# Patient Record
Sex: Female | Born: 1980 | Race: Black or African American | Hispanic: No | Marital: Married | Smoking: Current every day smoker
Health system: Southern US, Community
[De-identification: ages and names within clinical notes are randomized; demographics above are authoritative.]

## PROBLEM LIST (undated history)

## (undated) DIAGNOSIS — I639 Cerebral infarction, unspecified: Secondary | ICD-10-CM

## (undated) DIAGNOSIS — J45909 Unspecified asthma, uncomplicated: Secondary | ICD-10-CM

## (undated) DIAGNOSIS — H547 Unspecified visual loss: Secondary | ICD-10-CM

## (undated) DIAGNOSIS — D6861 Antiphospholipid syndrome: Secondary | ICD-10-CM

## (undated) DIAGNOSIS — I10 Essential (primary) hypertension: Secondary | ICD-10-CM

## (undated) HISTORY — PX: OTHER SURGICAL HISTORY: SHX169

## (undated) HISTORY — DX: Cerebral infarction, unspecified: I63.9

---

## 2014-04-04 DIAGNOSIS — D649 Anemia, unspecified: Secondary | ICD-10-CM | POA: Diagnosis not present

## 2014-04-04 DIAGNOSIS — Z136 Encounter for screening for cardiovascular disorders: Secondary | ICD-10-CM | POA: Diagnosis not present

## 2014-04-04 DIAGNOSIS — Z8673 Personal history of transient ischemic attack (TIA), and cerebral infarction without residual deficits: Secondary | ICD-10-CM | POA: Diagnosis not present

## 2014-04-04 DIAGNOSIS — H540 Blindness, both eyes: Secondary | ICD-10-CM | POA: Diagnosis not present

## 2014-04-04 DIAGNOSIS — E559 Vitamin D deficiency, unspecified: Secondary | ICD-10-CM | POA: Diagnosis not present

## 2014-04-04 DIAGNOSIS — R791 Abnormal coagulation profile: Secondary | ICD-10-CM | POA: Diagnosis not present

## 2014-04-04 DIAGNOSIS — Z7901 Long term (current) use of anticoagulants: Secondary | ICD-10-CM | POA: Diagnosis not present

## 2014-04-04 DIAGNOSIS — G479 Sleep disorder, unspecified: Secondary | ICD-10-CM | POA: Diagnosis not present

## 2014-04-04 DIAGNOSIS — D6861 Antiphospholipid syndrome: Secondary | ICD-10-CM | POA: Diagnosis not present

## 2014-04-04 DIAGNOSIS — Z8349 Family history of other endocrine, nutritional and metabolic diseases: Secondary | ICD-10-CM | POA: Diagnosis not present

## 2014-04-10 ENCOUNTER — Encounter (HOSPITAL_COMMUNITY): Payer: Self-pay | Admitting: Emergency Medicine

## 2014-04-10 ENCOUNTER — Emergency Department (HOSPITAL_COMMUNITY)
Admission: EM | Admit: 2014-04-10 | Discharge: 2014-04-10 | Disposition: A | Discharge status: Home / Self Care | Payer: Medicare Other | Attending: Emergency Medicine | Admitting: Emergency Medicine

## 2014-04-10 ENCOUNTER — Emergency Department (HOSPITAL_COMMUNITY): Payer: Medicare Other

## 2014-04-10 DIAGNOSIS — J45909 Unspecified asthma, uncomplicated: Secondary | ICD-10-CM | POA: Insufficient documentation

## 2014-04-10 DIAGNOSIS — I10 Essential (primary) hypertension: Secondary | ICD-10-CM | POA: Diagnosis not present

## 2014-04-10 DIAGNOSIS — M542 Cervicalgia: Secondary | ICD-10-CM | POA: Diagnosis not present

## 2014-04-10 DIAGNOSIS — S199XXA Unspecified injury of neck, initial encounter: Secondary | ICD-10-CM | POA: Diagnosis not present

## 2014-04-10 DIAGNOSIS — E669 Obesity, unspecified: Secondary | ICD-10-CM | POA: Insufficient documentation

## 2014-04-10 DIAGNOSIS — Z793 Long term (current) use of hormonal contraceptives: Secondary | ICD-10-CM | POA: Diagnosis not present

## 2014-04-10 DIAGNOSIS — M2578 Osteophyte, vertebrae: Secondary | ICD-10-CM | POA: Diagnosis not present

## 2014-04-10 DIAGNOSIS — Z7901 Long term (current) use of anticoagulants: Secondary | ICD-10-CM | POA: Insufficient documentation

## 2014-04-10 DIAGNOSIS — Z87891 Personal history of nicotine dependence: Secondary | ICD-10-CM | POA: Diagnosis not present

## 2014-04-10 DIAGNOSIS — Z79899 Other long term (current) drug therapy: Secondary | ICD-10-CM | POA: Insufficient documentation

## 2014-04-10 DIAGNOSIS — Z862 Personal history of diseases of the blood and blood-forming organs and certain disorders involving the immune mechanism: Secondary | ICD-10-CM | POA: Insufficient documentation

## 2014-04-10 DIAGNOSIS — H540 Blindness, both eyes: Secondary | ICD-10-CM | POA: Diagnosis not present

## 2014-04-10 DIAGNOSIS — R51 Headache: Secondary | ICD-10-CM | POA: Diagnosis not present

## 2014-04-10 HISTORY — DX: Essential (primary) hypertension: I10

## 2014-04-10 HISTORY — DX: Unspecified asthma, uncomplicated: J45.909

## 2014-04-10 HISTORY — DX: Unspecified visual loss: H54.7

## 2014-04-10 HISTORY — DX: Antiphospholipid syndrome: D68.61

## 2014-04-10 LAB — PROTIME-INR
INR: 1.88 — ABNORMAL HIGH (ref 0.00–1.49)
Prothrombin Time: 21.8 seconds — ABNORMAL HIGH (ref 11.6–15.2)

## 2014-04-10 MED ORDER — OXYCODONE-ACETAMINOPHEN 5-325 MG PO TABS: 1.0000 | ORAL_TABLET | ORAL | Status: DC | PRN

## 2014-04-10 MED ORDER — HYDROMORPHONE HCL 1 MG/ML IJ SOLN
1.0000 mg | Freq: Once | INTRAMUSCULAR | Status: AC
  Administered 2014-04-10: 1 mg via INTRAMUSCULAR
  Filled 2014-04-10: qty 1

## 2014-04-10 MED ORDER — DIPHENHYDRAMINE HCL 25 MG PO CAPS
25.0000 mg | ORAL_CAPSULE | Freq: Once | ORAL | Status: AC
  Administered 2014-04-10: 25 mg via ORAL
  Filled 2014-04-10: qty 1

## 2014-04-10 NOTE — ED Notes (Signed)
Bed: WTR5 Expected date:  Expected time:  Means of arrival:  Comments: Neck pain

## 2014-04-10 NOTE — ED Notes (Signed)
Pt reports that she has been having increased neck pain for the past week. Pt has recently moved, found a new PCP that checked her INR since she was on Coumadin and it was too high. New PCP told her to not take any Coumadin over the weekend and to return. Pt states pain was too much to bear to wait until she could be seen again. 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

## 2014-04-10 NOTE — Progress Notes (Signed)
CSW received notification from RN stating that the patient is ready to be discharged and needs a taxi voucher. CSW met with patient at bedside and confirmed that the patient wanted to be driven to [address]. CSW called taxi and informed them that the patient is ready for pick up.  Brittney Whitaker, LCSWA [phone] ED CSW 04/10/2014 3:52 PM    

## 2014-04-10 NOTE — ED Notes (Signed)
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 Pt also sts she takes coumadin for blood disorder APAS and last Thursday went to her PCP to have her levels checked and was told to stop taking it until Sunday (it was 6.1). Pt was supposed to have it rechecked, however due to neck pain she didn't go back, however she did start taking her coumadin again on Sunday as discussed with her doctor.

## 2014-04-10 NOTE — Progress Notes (Signed)
CSW received notification from RN that pt brought in by [facility] and will need assist with transportation via bus pass or cab voucher when released. Per RN, pt not yet released and RN will notify CSW by calling [phone] when assistance with transport needed.  Loletta SpecterSuzanna Kidd, MSW, LCSW Clinical Social Work Coverage for [employer]en Reed, [place] [phone]

## 2014-04-15 NOTE — ED Provider Notes (Signed)
CSN: [id number]     Arrival date & time 04/10/14  1151 History   First MD Initiated Contact with Patient 04/10/14 1232     Chief Complaint  Patient presents with  . Neck Pain     (Consider location/radiation/quality/duration/timing/severity/associated sxs/prior Treatment) HPI   34 year old female with neck pain. Gradual onset a few days ago and progressively worsening. Pain is worse with certain movements. She denies any trauma. She does report that she holds her head down for long periods of time as required by her new employment. No fevers or chills. No acute numbness, tingling or focal loss of strength.  Past Medical History  Diagnosis Date  . Blind   . Blind   . Anti-phospholipid antibody syndrome   . Hypertension   . Asthma    History reviewed. No pertinent past surgical history. History reviewed. No pertinent family history. History  Substance Use Topics  . Smoking status: Former Smoker    Types: Cigarettes    Quit date: 04/04/2014  . Smokeless tobacco: Never Used  . Alcohol Use: Yes     Comment: occ.   OB History    No data available     Review of Systems  All systems reviewed and negative, other than as noted in HPI.   Allergies  Review of patient's allergies indicates no known allergies.  Home Medications   Prior to Admission medications   Medication Sig Start Date End Date Taking? Authorizing Provider  albuterol (PROVENTIL HFA;VENTOLIN HFA) 108 (90 BASE) MCG/ACT inhaler Inhale 1-2 puffs into the lungs every 6 (six) hours as needed for wheezing or shortness of breath.   Yes Historical Provider, MD  lisinopril (PRINIVIL,ZESTRIL) 10 MG tablet Take 10 mg by mouth daily.   Yes Historical Provider, MD  norethindrone-ethinyl estradiol 1/35 (ORTHO-NOVUM, NORTREL,CYCLAFEM) tablet Take 1 tablet by mouth daily.   Yes Historical Provider, MD  Pseudoephedrine-APAP-DM (DAYQUIL PO) Take 2 capsules by mouth every 8 (eight) hours.   Yes Historical Provider, MD  temazepam  (RESTORIL) 30 MG capsule Take 30 mg by mouth at bedtime as needed for sleep.   Yes Historical Provider, MD  valACYclovir (VALTREX) 500 MG tablet Take 500 mg by mouth daily.   Yes Historical Provider, MD  warfarin (COUMADIN) 4 MG tablet Take 8 mg by mouth daily.   Yes Historical Provider, MD  oxyCODONE-acetaminophen (PERCOCET/ROXICET) 5-325 MG per tablet Take 1 tablet by mouth every 4 (four) hours as needed for severe pain. 04/10/14   Raeford RazorStephen Daliana Leverett, MD   BP 138/70 mmHg  Pulse 82  Temp(Src) 98 F (36.7 C) (Oral)  Resp 17  Ht 5\' 2"  (1.575 m)  Wt 370 lb (167.831 kg)  BMI 67.66 kg/m2  SpO2 93%  LMP 03/27/2014 (Approximate) Physical Exam  Constitutional: She appears well-developed and well-nourished. No distress.  HENT:  Head: Normocephalic and atraumatic.  Eyes: Conjunctivae are normal. Right eye exhibits no discharge. Left eye exhibits no discharge.  Neck: Neck supple.  Obesity limits examination, but it gets grossly unremarkable in appearance. Trachea is midline. Thyroid does not seem appreciably enlarged. No adenopathy or other masses appreciated. Normal stomach phonation. No skin lesions noted. Some tenderness to the posterior neck, but this seems more paraspinal than in the midline  Cardiovascular: Normal rate, regular rhythm and normal heart sounds.  Exam reveals no gallop and no friction rub.   No murmur heard. Pulmonary/Chest: Effort normal and breath sounds normal. No respiratory distress.  Abdominal: Soft. She exhibits no distension. There is no tenderness.  Musculoskeletal:  She exhibits no edema or tenderness.  Neurological: She is alert.  Strength is 5 out of 5 bilateral upper extremities. Sensation is intact to light touch. Easily palpable radial pulses. Cap refill is brisk in the fingertips.  Skin: Skin is warm and dry.  Psychiatric: She has a normal mood and affect. Her behavior is normal. Thought content normal.  Nursing note and vitals reviewed.   ED Course  Procedures  (including critical care time) Labs Review Labs Reviewed  PROTIME-INR - Abnormal; Notable for the following:    Prothrombin Time 21.8 (*)    INR 1.88 (*)    All other components within normal limits    Imaging Review No results found.   EKG Interpretation None      MDM   Final diagnoses:  Neck pain    34 year old female with neck pain. Atraumatic. Nonfocal neuro exam. Suspect strain, possibly related to head positioning while working. Low suspicion for emergent process.    Raeford Razor, MD 04/15/14 1025

## 2014-04-22 DIAGNOSIS — Z7901 Long term (current) use of anticoagulants: Secondary | ICD-10-CM | POA: Diagnosis not present

## 2014-04-22 DIAGNOSIS — M542 Cervicalgia: Secondary | ICD-10-CM | POA: Diagnosis not present

## 2014-05-29 DIAGNOSIS — J209 Acute bronchitis, unspecified: Secondary | ICD-10-CM | POA: Diagnosis not present

## 2014-05-29 DIAGNOSIS — J329 Chronic sinusitis, unspecified: Secondary | ICD-10-CM | POA: Diagnosis not present

## 2014-05-29 DIAGNOSIS — R42 Dizziness and giddiness: Secondary | ICD-10-CM | POA: Diagnosis not present

## 2014-05-29 DIAGNOSIS — R079 Chest pain, unspecified: Secondary | ICD-10-CM | POA: Diagnosis not present

## 2014-05-29 DIAGNOSIS — R05 Cough: Secondary | ICD-10-CM | POA: Diagnosis not present

## 2014-05-30 ENCOUNTER — Encounter (HOSPITAL_COMMUNITY): Payer: Self-pay

## 2014-05-30 ENCOUNTER — Inpatient Hospital Stay (HOSPITAL_COMMUNITY)
Admission: EM | Admit: 2014-05-30 | Discharge: 2014-06-01 | DRG: 202 | Disposition: A | Discharge status: Home / Self Care | Payer: Medicare Other | Attending: Internal Medicine | Admitting: Internal Medicine

## 2014-05-30 ENCOUNTER — Emergency Department (HOSPITAL_COMMUNITY): Payer: Medicare Other

## 2014-05-30 DIAGNOSIS — J019 Acute sinusitis, unspecified: Secondary | ICD-10-CM

## 2014-05-30 DIAGNOSIS — R0602 Shortness of breath: Secondary | ICD-10-CM | POA: Insufficient documentation

## 2014-05-30 DIAGNOSIS — R0989 Other specified symptoms and signs involving the circulatory and respiratory systems: Secondary | ICD-10-CM | POA: Diagnosis not present

## 2014-05-30 DIAGNOSIS — Z6841 Body Mass Index (BMI) 40.0 and over, adult: Secondary | ICD-10-CM

## 2014-05-30 DIAGNOSIS — Z720 Tobacco use: Secondary | ICD-10-CM

## 2014-05-30 DIAGNOSIS — I10 Essential (primary) hypertension: Secondary | ICD-10-CM | POA: Diagnosis not present

## 2014-05-30 DIAGNOSIS — F17210 Nicotine dependence, cigarettes, uncomplicated: Secondary | ICD-10-CM | POA: Insufficient documentation

## 2014-05-30 DIAGNOSIS — H548 Legal blindness, as defined in USA: Secondary | ICD-10-CM | POA: Diagnosis present

## 2014-05-30 DIAGNOSIS — R079 Chest pain, unspecified: Secondary | ICD-10-CM | POA: Diagnosis not present

## 2014-05-30 DIAGNOSIS — Z7901 Long term (current) use of anticoagulants: Secondary | ICD-10-CM

## 2014-05-30 DIAGNOSIS — E669 Obesity, unspecified: Secondary | ICD-10-CM | POA: Diagnosis present

## 2014-05-30 DIAGNOSIS — J45901 Unspecified asthma with (acute) exacerbation: Principal | ICD-10-CM | POA: Diagnosis present

## 2014-05-30 DIAGNOSIS — J0100 Acute maxillary sinusitis, unspecified: Secondary | ICD-10-CM

## 2014-05-30 DIAGNOSIS — R05 Cough: Secondary | ICD-10-CM | POA: Diagnosis not present

## 2014-05-30 DIAGNOSIS — Z87891 Personal history of nicotine dependence: Secondary | ICD-10-CM | POA: Diagnosis not present

## 2014-05-30 DIAGNOSIS — T380X5A Adverse effect of glucocorticoids and synthetic analogues, initial encounter: Secondary | ICD-10-CM | POA: Diagnosis present

## 2014-05-30 DIAGNOSIS — D72829 Elevated white blood cell count, unspecified: Secondary | ICD-10-CM | POA: Diagnosis present

## 2014-05-30 DIAGNOSIS — I340 Nonrheumatic mitral (valve) insufficiency: Secondary | ICD-10-CM | POA: Diagnosis present

## 2014-05-30 DIAGNOSIS — I2699 Other pulmonary embolism without acute cor pulmonale: Secondary | ICD-10-CM | POA: Diagnosis not present

## 2014-05-30 DIAGNOSIS — R0789 Other chest pain: Secondary | ICD-10-CM | POA: Diagnosis not present

## 2014-05-30 DIAGNOSIS — J45909 Unspecified asthma, uncomplicated: Secondary | ICD-10-CM | POA: Diagnosis present

## 2014-05-30 DIAGNOSIS — D6861 Antiphospholipid syndrome: Secondary | ICD-10-CM | POA: Diagnosis present

## 2014-05-30 DIAGNOSIS — R000 Tachycardia, unspecified: Secondary | ICD-10-CM | POA: Diagnosis present

## 2014-05-30 HISTORY — DX: Tobacco use: Z72.0

## 2014-05-30 LAB — CBC WITH DIFFERENTIAL/PLATELET
Basophils Absolute: 0.1 10*3/uL (ref 0.0–0.1)
Basophils Relative: 1 % (ref 0–1)
EOS ABS: 0.1 10*3/uL (ref 0.0–0.7)
Eosinophils Relative: 1 % (ref 0–5)
HCT: 37.3 % (ref 36.0–46.0)
Hemoglobin: 12.2 g/dL (ref 12.0–15.0)
LYMPHS ABS: 1.5 10*3/uL (ref 0.7–4.0)
Lymphocytes Relative: 16 % (ref 12–46)
MCH: 27 pg (ref 26.0–34.0)
MCHC: 32.7 g/dL (ref 30.0–36.0)
MCV: 82.5 fL (ref 78.0–100.0)
Monocytes Absolute: 0.2 10*3/uL (ref 0.1–1.0)
Monocytes Relative: 2 % — ABNORMAL LOW (ref 3–12)
NEUTROS ABS: 7.3 10*3/uL (ref 1.7–7.7)
Neutrophils Relative %: 80 % — ABNORMAL HIGH (ref 43–77)
Platelets: ADEQUATE 10*3/uL (ref 150–400)
RBC: 4.52 MIL/uL (ref 3.87–5.11)
RDW: 15.1 % (ref 11.5–15.5)
WBC: 9.2 10*3/uL (ref 4.0–10.5)

## 2014-05-30 LAB — LIPID PANEL
CHOLESTEROL: 170 mg/dL (ref 0–200)
HDL: 52 mg/dL (ref >39)
LDL Cholesterol: 99 mg/dL (ref 0–99)
TRIGLYCERIDES: 96 mg/dL (ref <150)
Total CHOL/HDL Ratio: 3.3 RATIO
VLDL: 19 mg/dL (ref 0–40)

## 2014-05-30 LAB — BASIC METABOLIC PANEL
ANION GAP: 12 (ref 5–15)
BUN: 12 mg/dL (ref 6–23)
CO2: 21 mmol/L (ref 19–32)
Calcium: 9.2 mg/dL (ref 8.4–10.5)
Chloride: 105 mmol/L (ref 96–112)
Creatinine, Ser: 0.68 mg/dL (ref 0.50–1.10)
GFR calc Af Amer: >90 mL/min (ref >90)
GFR calc non Af Amer: >90 mL/min (ref >90)
GLUCOSE: 113 mg/dL — AB (ref 70–99)
POTASSIUM: 3.8 mmol/L (ref 3.5–5.1)
Sodium: 138 mmol/L (ref 135–145)

## 2014-05-30 LAB — I-STAT TROPONIN, ED: TROPONIN I, POC: 0 ng/mL (ref 0.00–0.08)

## 2014-05-30 LAB — PHOSPHORUS: PHOSPHORUS: 1.6 mg/dL — AB (ref 2.3–4.6)

## 2014-05-30 LAB — PROTIME-INR
INR: 2.5 — ABNORMAL HIGH (ref 0.00–1.49)
PROTHROMBIN TIME: 27.2 s — AB (ref 11.6–15.2)

## 2014-05-30 LAB — MAGNESIUM: Magnesium: 1.8 mg/dL (ref 1.5–2.5)

## 2014-05-30 LAB — TROPONIN I: Troponin I: <0.03 ng/mL (ref <0.031)

## 2014-05-30 LAB — TSH: TSH: 1.146 u[IU]/mL (ref 0.350–4.500)

## 2014-05-30 LAB — BRAIN NATRIURETIC PEPTIDE: B NATRIURETIC PEPTIDE 5: 31 pg/mL (ref 0.0–100.0)

## 2014-05-30 MED ORDER — HYDROCODONE-ACETAMINOPHEN 5-325 MG PO TABS
1.0000 | ORAL_TABLET | ORAL | Status: DC | PRN
  Administered 2014-05-30 – 2014-06-01 (×3): 1 via ORAL
  Filled 2014-05-30 (×5): qty 1

## 2014-05-30 MED ORDER — ALBUTEROL (5 MG/ML) CONTINUOUS INHALATION SOLN
10.0000 mg/h | INHALATION_SOLUTION | RESPIRATORY_TRACT | Status: DC
  Administered 2014-05-30: 10 mg/h via RESPIRATORY_TRACT
  Filled 2014-05-30: qty 20

## 2014-05-30 MED ORDER — SODIUM CHLORIDE 0.9 % IJ SOLN: 3.0000 mL | INTRAMUSCULAR | Status: DC | PRN

## 2014-05-30 MED ORDER — PREDNISONE 20 MG PO TABS
40.0000 mg | ORAL_TABLET | Freq: Every day | ORAL | Status: DC
  Administered 2014-05-31: 40 mg via ORAL
  Filled 2014-05-30: qty 2

## 2014-05-30 MED ORDER — WARFARIN - PHARMACIST DOSING INPATIENT: Freq: Every day | Status: DC

## 2014-05-30 MED ORDER — MORPHINE SULFATE 4 MG/ML IJ SOLN
4.0000 mg | Freq: Once | INTRAMUSCULAR | Status: AC
  Administered 2014-05-30: 4 mg via INTRAVENOUS
  Filled 2014-05-30: qty 1

## 2014-05-30 MED ORDER — TEMAZEPAM 15 MG PO CAPS
30.0000 mg | ORAL_CAPSULE | Freq: Every evening | ORAL | Status: DC | PRN
  Administered 2014-05-30 – 2014-05-31 (×2): 30 mg via ORAL
  Filled 2014-05-30 (×2): qty 2

## 2014-05-30 MED ORDER — PNEUMOCOCCAL VAC POLYVALENT 25 MCG/0.5ML IJ INJ
0.5000 mL | INJECTION | INTRAMUSCULAR | Status: AC
  Administered 2014-05-31: 0.5 mL via INTRAMUSCULAR
  Filled 2014-05-30 (×3): qty 0.5

## 2014-05-30 MED ORDER — ONDANSETRON HCL 4 MG PO TABS: 4.0000 mg | ORAL_TABLET | Freq: Four times a day (QID) | ORAL | Status: DC | PRN

## 2014-05-30 MED ORDER — AMOXICILLIN-POT CLAVULANATE 875-125 MG PO TABS
1.0000 | ORAL_TABLET | Freq: Two times a day (BID) | ORAL | Status: DC
  Administered 2014-05-30 – 2014-06-01 (×4): 1 via ORAL
  Filled 2014-05-30 (×4): qty 1

## 2014-05-30 MED ORDER — IPRATROPIUM-ALBUTEROL 0.5-2.5 (3) MG/3ML IN SOLN
3.0000 mL | Freq: Four times a day (QID) | RESPIRATORY_TRACT | Status: DC
  Administered 2014-05-30 – 2014-06-01 (×9): 3 mL via RESPIRATORY_TRACT
  Filled 2014-05-30 (×9): qty 3

## 2014-05-30 MED ORDER — METHYLPREDNISOLONE SODIUM SUCC 40 MG IJ SOLR: 40.0000 mg | Freq: Two times a day (BID) | INTRAMUSCULAR | Status: DC

## 2014-05-30 MED ORDER — ACETAMINOPHEN 650 MG RE SUPP: 650.0000 mg | Freq: Four times a day (QID) | RECTAL | Status: DC | PRN

## 2014-05-30 MED ORDER — ACETAMINOPHEN 325 MG PO TABS
650.0000 mg | ORAL_TABLET | Freq: Four times a day (QID) | ORAL | Status: DC | PRN
Start: 2014-05-30 — End: 2014-06-01

## 2014-05-30 MED ORDER — LISINOPRIL 10 MG PO TABS
10.0000 mg | ORAL_TABLET | Freq: Every day | ORAL | Status: DC
  Administered 2014-05-30 – 2014-06-01 (×3): 10 mg via ORAL
  Filled 2014-05-30 (×3): qty 1

## 2014-05-30 MED ORDER — IPRATROPIUM-ALBUTEROL 0.5-2.5 (3) MG/3ML IN SOLN: 3.0000 mL | RESPIRATORY_TRACT | Status: DC

## 2014-05-30 MED ORDER — SODIUM CHLORIDE 0.9 % IV SOLN: 250.0000 mL | INTRAVENOUS | Status: DC | PRN

## 2014-05-30 MED ORDER — MORPHINE SULFATE 2 MG/ML IJ SOLN
2.0000 mg | INTRAMUSCULAR | Status: DC | PRN
  Administered 2014-05-31 – 2014-06-01 (×3): 2 mg via INTRAVENOUS
  Filled 2014-05-30 (×3): qty 1

## 2014-05-30 MED ORDER — METHYLPREDNISOLONE SODIUM SUCC 40 MG IJ SOLR: 40.0000 mg | Freq: Once | INTRAMUSCULAR | Status: DC

## 2014-05-30 MED ORDER — VALACYCLOVIR HCL 500 MG PO TABS
500.0000 mg | ORAL_TABLET | Freq: Every day | ORAL | Status: DC
  Administered 2014-05-30 – 2014-06-01 (×3): 500 mg via ORAL
  Filled 2014-05-30 (×3): qty 1

## 2014-05-30 MED ORDER — ALBUTEROL SULFATE (2.5 MG/3ML) 0.083% IN NEBU
2.5000 mg | INHALATION_SOLUTION | RESPIRATORY_TRACT | Status: DC | PRN
  Administered 2014-05-30 – 2014-06-01 (×4): 2.5 mg via RESPIRATORY_TRACT
  Filled 2014-05-30 (×4): qty 3

## 2014-05-30 MED ORDER — SODIUM CHLORIDE 0.9 % IJ SOLN: 3.0000 mL | Freq: Two times a day (BID) | INTRAMUSCULAR | Status: DC

## 2014-05-30 MED ORDER — ONDANSETRON HCL 4 MG/2ML IJ SOLN: 4.0000 mg | Freq: Four times a day (QID) | INTRAMUSCULAR | Status: DC | PRN

## 2014-05-30 NOTE — ED Notes (Signed)
IV TEAM at bedside 

## 2014-05-30 NOTE — ED Notes (Signed)
Communication with RT

## 2014-05-30 NOTE — Progress Notes (Signed)
Patient is blind 

## 2014-05-30 NOTE — ED Notes (Addendum)
Paged MD about medication administration. New Orders

## 2014-05-30 NOTE — Progress Notes (Signed)
Spoke with MD concerning patients telemetry order and was told by MD that patient should have gone to telemetry floor. Request for telemetry bed iniatated.

## 2014-05-30 NOTE — ED Notes (Signed)
Hospitalist at the bedside 

## 2014-05-30 NOTE — ED Notes (Signed)
Pt became tacky when ambulated her hart rate went up to 156 and ox 95% notified provider

## 2014-05-30 NOTE — H&P (Signed)
[facility] History and Physical  Francely Craw ZOX:[id number] DOB: 26-Aug-1980 DOA: 05/30/2014  Referring physician:  PCP: No primary care provider on file.  Specialists:   Chief Complaint: Shortness of breath, Chest pressure  HPI: Kara Zamora is a 34 y.o. female  With a history of antiphospholipid syndrome, legal blindness, asthma, hypertension that presented to the emergency department with complaints of shortness of breath and chest pain. Patient states that yesterday she was at work she started having chest pressure located in left side of her chest with no radiation. She states that times she feels pain behind her heart. The pain is 10/10, worse with coughing and deep breathing. Patient also complains of shortness of breath for the past several months. She has had sinus problems as well as postnasal drip and coughing for approximately 2-3 weeks. Patient also admits to smoking.  Patient states she's not seen a physician that she moved down here she felt that these issues were due to her allergies.  In the ER, patient given nebulizer treatments, Solu-Medrol, magnesium. Patient is PO2 is in the 96-98% on room air. Patient was then ambulated and found to be very tachycardic with shortness of breath and continued wheezing.  TRH was called for admission.  Review of Systems:  Constitutional: Denies fever, chills, diaphoresis, appetite change and fatigue.  HEENT: Complains of runny nose, postnasal drip, sinus issues for 3 weeks. Respiratory: Complains of shortness of breath cough and wheezing for several months. Cardiovascular: Complains of chest pressure and tightness on the left side. Gastrointestinal: Denies nausea, vomiting, abdominal pain, diarrhea, constipation, blood in stool and abdominal distention.  Genitourinary: Denies dysuria, urgency, frequency, hematuria, flank pain and difficulty urinating.  Musculoskeletal: Denies myalgias, back pain, joint swelling, arthralgias and gait  problem.  Skin: Denies pallor, rash and wound.  Neurological: Denies dizziness, seizures, syncope, weakness, light-headedness, numbness and headaches.  Hematological: Denies adenopathy. Easy bruising, personal or family bleeding history  Psychiatric/Behavioral: Denies suicidal ideation, mood changes, confusion, nervousness, sleep disturbance and agitation  Past Medical History  Diagnosis Date  . Blind   . Blind   . Anti-phospholipid antibody syndrome   . Hypertension   . Asthma    History reviewed. No pertinent past surgical history. Social History:  reports that she quit smoking about 8 weeks ago. Her smoking use included Cigarettes. She has never used smokeless tobacco. She reports that she drinks alcohol. She reports that she does not use illicit drugs.   No Known Allergies  Family history Hypertension  Prior to Admission medications   Medication Sig Start Date End Date Taking? Authorizing Provider  albuterol (PROVENTIL HFA;VENTOLIN HFA) 108 (90 BASE) MCG/ACT inhaler Inhale 1-2 puffs into the lungs every 6 (six) hours as needed for wheezing or shortness of breath.   Yes Historical Provider, MD  lisinopril (PRINIVIL,ZESTRIL) 10 MG tablet Take 10 mg by mouth daily.   Yes Historical Provider, MD  norethindrone-ethinyl estradiol 1/35 (ORTHO-NOVUM, NORTREL,CYCLAFEM) tablet Take 1 tablet by mouth daily.   Yes Historical Provider, MD  temazepam (RESTORIL) 30 MG capsule Take 30 mg by mouth at bedtime as needed for sleep.   Yes Historical Provider, MD  valACYclovir (VALTREX) 500 MG tablet Take 500 mg by mouth daily.   Yes Historical Provider, MD  warfarin (COUMADIN) 4 MG tablet Take 4-8 mg by mouth daily.  on Mondays, wednesdays and fridays.  all other days   Yes Historical Provider, MD   Physical Exam: Filed Vitals:   05/30/14 1230  BP: 160/82  Pulse:  Temp:   Resp: 15     General: Well developed, well nourished, NAD, appears stated age  HEENT: NCAT, PERRLA, EOMI,  Anicteic Sclera, mucous membranes moist. Tenderness of maxillary sinuses  Neck: Supple, no JVD, no masses  Cardiovascular: S1 S2 auscultated, no rubs, murmurs or gallops. Regular rate and rhythm.  Respiratory: Expiratory wheezing.  Abdomen: Soft, obese, nontender, nondistended, + bowel sounds  Extremities: warm dry without cyanosis clubbing or edema  Neuro: AAOx3, Strength 5/5 in patient's upper and lower extremities bilaterally, patient blind in both eyes, otherwise Cranial nerve testing normal  Skin: Without rashes exudates or nodules  Psych: Normal affect and demeanor with intact judgement and insight  Labs on Admission:  Basic Metabolic Panel:  Recent Labs Lab 05/30/14 1002  NA 138  K 3.8  CL 105  CO2 21  GLUCOSE 113*  BUN 12  CREATININE 0.68  CALCIUM 9.2   Liver Function Tests: No results for input(s): AST, ALT, ALKPHOS, BILITOT, PROT, ALBUMIN in the last 168 hours. No results for input(s): LIPASE, AMYLASE in the last 168 hours. No results for input(s): AMMONIA in the last 168 hours. CBC:  Recent Labs Lab 05/30/14 1002  WBC 9.2  NEUTROABS 7.3  HGB 12.2  HCT 37.3  MCV 82.5  PLT PLATELET CLUMPS NOTED ON SMEAR, COUNT APPEARS ADEQUATE   Cardiac Enzymes: No results for input(s): CKTOTAL, CKMB, CKMBINDEX, TROPONINI in the last 168 hours.  BNP (last 3 results)  Recent Labs  05/30/14 1001  BNP 31.0    ProBNP (last 3 results) No results for input(s): PROBNP in the last 8760 hours.  CBG: No results for input(s): GLUCAP in the last 168 hours.  Radiological Exams on Admission: Dg Chest 2 View  05/30/2014   CLINICAL DATA:  Progressive cough congestion and shortness of breath and wheezing over the past several days, history of asthmatic bronchitis, occasional smoke were  EXAM: CHEST  2 VIEW  COMPARISON:  None.  FINDINGS: The lungs are adequately inflated. The interstitial markings are coarse bilaterally. There is no alveolar infiltrate. There is no pleural  effusion. The heart and pulmonary vascularity are normal. The mediastinum is normal in width. The bony thorax is unremarkable.  IMPRESSION: Mild prominence of the interstitial markings may reflect the patient's known reactive airway disease. There is no evidence of pneumonia.   Electronically Signed   By: David  [place]   On: 05/30/2014 12:16    EKG: Independently reviewed. Sinus rhythm, rate 90  Assessment/Plan Principal Problem:   Acute asthma exacerbation Active Problems:   Asthma exacerbation   Antiphospholipid syndrome   Chest pressure   Tobacco abuse   Sinusitis, acute  Acute Asthma Exacerbation -Patient will be admitted to telemetry  -Patient received multiple nebulizer treatments in the emergency room without resolution of her symptoms -Will admit the patient, place her on supplemental oxygen, nebulizer treatments, steroids, antitussives -CXR mild prominence of interstitial markings may reflect reactive airway disease, no evidence of pneumonia  Chest pressure -Likely secondary to her asthma exacerbation -First troponin negative  -Will obtain echocardiogram, cycle troponins, magnesium, phosphate, lipid panel -BNP 31  Possible sinusitis -Will place patient on Augmentin and nasal spray -Has had sinus pain and pressure for 3 weeks  Antiphospholipid Syndrome -Continue warfarin per pharmacy -Currently therapeutic  Tobacco abuse -Patient counseled regarding smoking cessation   Essential hypertension -Continue lisinopril  Legally Blind    DVT prophylaxis: Warfarin  Code Status: Full  Condition: Guarded  Family Communication: None at bedside. Admission, patients condition and  plan of care including tests being ordered have been discussed with the patient, who indicates understanding and agrees with the plan and Code Status.  Disposition Plan: Admitted for observation  Time spent: 60 minutes  Tangala Wiegert D.O. [facility] Pager [phone]  If 7PM-7AM,  please contact night-coverage [email or web address] Password [facility] 05/30/2014, 2:23 PM

## 2014-05-30 NOTE — ED Notes (Signed)
Per [facility] pt c/o of DX Bronchitis. PT IS BLIND. C/o of shortness of breath and CP started on Monday. Wheezing present upon arrival. Total of 10mg  Abuterol and 0.5mg   atrovent. 125mg  solumedrol IVP.   2 grams of MG IVP given. EKG NSR and unremarkable. Coumadin with "HX of blood clot in brain".  CP and wheezing improved

## 2014-05-30 NOTE — ED Notes (Signed)
Attempted IV X2 Ultrasound guided. Another RN to attempt

## 2014-05-30 NOTE — Progress Notes (Signed)
Notified MD that IV team would be unable to place peripheral IV for patient d/t poor veins, was assessed using US in ED, MD was made aware that patient would need central line if they would like patient to have access. MD stated that she would discontinue IV medications.

## 2014-05-30 NOTE — ED Notes (Signed)
Pt refused Norco 

## 2014-05-30 NOTE — ED Provider Notes (Signed)
CSN: [id number]     Arrival date & time 05/30/14  0901 History   First MD Initiated Contact with Patient 05/30/14 [phone]     Chief Complaint  Patient presents with  . Shortness of Breath  . Wheezing  . Chest Pain     (Consider location/radiation/quality/duration/timing/severity/associated sxs/prior Treatment) HPI Comments: Jo-Ann Johanning is a 34 y.o. 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 She reports the tightness is 10/10 located to the left side of her chest, nonradiating, intermittent, worse with coughing and breathing, with no medications tried at home prior to calling the ambulance, and unrelieved with the  albuterol, 0.5mg  Atrovent,  Solu-Medrol, and 2g magnesium given to her in the ambulance, although she reports that her shortness breath and wheezing has improved after these medications. She also endorses some dizziness earlier, as well as rhinorrhea x2wks and 2wks of cough with sputum production she is unable to describe due to her baseline blindness. She denies any fevers, chills, leg swelling, recent travel/surgery/immobilization, history of DVT or PE (other than brain blood clot, no other clotting reported), abdominal pain, nausea, vomiting, diarrhea, constipation, dysuria, malodorous urine, vaginal bleeding or discharge, numbness, tingling, weakness, diaphoresis, claudication, PND, orthopnea, or unexpected weight change. She takes OCPs which contain estrogen. She is compliant with all meds, and had her INR checked last month and states it was "normal".   Patient is a 34 y.o. female presenting with shortness of breath, wheezing, and chest pain. The history is provided by the patient. No language interpreter was used.  Shortness of Breath Severity:  Moderate Onset  quality:  Gradual Duration:  3 days Timing:  Intermittent Progression:  Unchanged Chronicity:  Recurrent Context: URI   Relieved by:  Inhaler Worsened by:  Coughing Ineffective treatments:  None tried Associated symptoms: chest pain, cough, sputum production (unsure of color since pt is blind at baseline) and wheezing   Associated symptoms: no abdominal pain, no claudication, no diaphoresis, no ear pain, no fever, no neck pain, no PND, no sore throat, no syncope and no vomiting  Hemoptysis: unsure since pt is blind at baseline.   Risk factors: obesity and oral contraceptive use   Risk factors: no hx of PE/DVT (blood clot in brain per pt, on coumadin), no prolonged immobilization, no recent surgery and no tobacco use   Wheezing Associated symptoms: chest pain, chest tightness, cough, rhinorrhea, shortness of breath and sputum production (unsure of color since pt is blind at baseline)   Associated symptoms: no ear pain, no fever, no orthopnea, no PND and no sore throat   Chest Pain Pain location:  L chest Pain quality: tightness   Pain radiates to:  Does not radiate Pain radiates to the back: no   Pain severity:  Moderate Onset quality:  Gradual Duration:  3 days Timing:  Intermittent Progression:  Unchanged Chronicity:  New Context: breathing   Relieved by:  None tried Worsened by:  Coughing and deep breathing Ineffective treatments:  None tried Associated symptoms: cough and shortness of breath   Associated symptoms: no abdominal pain, no altered mental status, no anxiety, no back pain, no claudication, no diaphoresis, no dysphagia, no fever, no heartburn, no lower extremity edema, no nausea, no near-syncope, no numbness, no orthopnea, no palpitations, no PND, no syncope, not vomiting and no weakness  Risk factors: birth control, hypertension and obesity   Risk factors: no immobilization, no smoking and no surgery     Past Medical History  Diagnosis Date  . Blind   . Blind     . Anti-phospholipid antibody syndrome   . Hypertension   . Asthma    No past surgical history on file. No family history on file. History  Substance Use Topics  . Smoking status: Former Smoker    Types: Cigarettes    Quit date: 04/04/2014  . Smokeless tobacco: Never Used  . Alcohol Use: Yes     Comment: occ.   OB History    No data available     Review of Systems  Constitutional: Negative for fever, chills, diaphoresis and unexpected weight change.  HENT: Positive for rhinorrhea. Negative for ear discharge, ear pain, sore throat and trouble swallowing.   Respiratory: Positive for cough, sputum production (unsure of color since pt is blind at baseline), chest tightness, shortness of breath and wheezing. Hemoptysis: unsure since pt is blind at baseline.   Cardiovascular: Positive for chest pain. Negative for palpitations, orthopnea, claudication, syncope, PND and near-syncope.  Gastrointestinal: Negative for heartburn, nausea, vomiting, abdominal pain, diarrhea and constipation.  Genitourinary: Negative for dysuria, hematuria, vaginal bleeding and vaginal discharge.  Musculoskeletal: Negative for myalgias, back pain, arthralgias and neck pain.  Skin: Negative for color change.  Neurological: Positive for light-headedness (not currently ongoing). Negative for syncope, weakness and numbness.  Psychiatric/Behavioral: Negative for confusion.   10 Systems reviewed and are negative for acute change except as noted in the HPI.    Allergies  Review of patient's allergies indicates no known allergies.  Home Medications   Prior to Admission medications   Medication Sig Start Date End Date Taking? Authorizing Provider  albuterol (PROVENTIL HFA;VENTOLIN HFA) 108 (90 BASE) MCG/ACT inhaler Inhale 1-2 puffs into the lungs every 6 (six) hours as needed for wheezing or shortness of breath.    Historical Provider, MD  lisinopril (PRINIVIL,ZESTRIL) 10 MG tablet Take 10 mg by mouth daily.     Historical Provider, MD  norethindrone-ethinyl estradiol 1/35 (ORTHO-NOVUM, NORTREL,CYCLAFEM) tablet Take 1 tablet by mouth daily.    Historical Provider, MD  oxyCODONE-acetaminophen (PERCOCET/ROXICET) 5-325 MG per tablet Take 1 tablet by mouth every 4 (four) hours as needed for severe pain. 04/10/14   Raeford Razor, MD  Pseudoephedrine-APAP-DM (DAYQUIL PO) Take 2 capsules by mouth every 8 (eight) hours.    Historical Provider, MD  temazepam (RESTORIL) 30 MG capsule Take 30 mg by mouth at bedtime as needed for sleep.    Historical Provider, MD  valACYclovir (VALTREX) 500 MG tablet Take 500 mg by mouth daily.    Historical Provider, MD  warfarin (COUMADIN) 4 MG tablet Take 8 mg by mouth daily.    Historical Provider, MD   BP 129/64 mmHg  Pulse 80  Temp(Src) 97.3 F (36.3 C) (Oral)  Resp 12  Ht 5\' 2"  (1.575 m)  Wt 373 lb (169.192 kg)  BMI 68.21 kg/m2  SpO2 100% Physical Exam  Constitutional: She is oriented to person, place, and time. Vital signs are normal. She appears well-developed and well-nourished.  Non-toxic appearance. No distress.  Afebrile, nontoxic, NAD  HENT:  Head: Normocephalic and atraumatic.  Nose: Mucosal edema and rhinorrhea present.  Mouth/Throat: Oropharynx is clear and moist and mucous membranes are normal.  Mild rhinorrhea and mucosal edema of turbinates bilaterally  Eyes: Conjunctivae and EOM are normal. Right eye exhibits no discharge. Left eye exhibits no  discharge.  Baseline blindness  Neck: Normal range of motion. Neck supple.  Cardiovascular: Normal rate, regular rhythm, normal heart sounds and intact distal pulses.  Exam reveals no gallop and no friction rub.   No murmur heard. RRR, nl s1/s2, no m/r/g, distal pulses intact, 1+ pitting pedal edema bilaterally symmetrically  Pulmonary/Chest: Effort normal. No respiratory distress. She has no decreased breath sounds. She has wheezes. She has no rhonchi. She has no rales. She exhibits tenderness. She exhibits no  crepitus, no deformity and no retraction.    Diffuse wheezing throughout all lung fields, no rhonchi or rales, no hypoxia or increased WOB, speaking in full sentences, SpO2 96-98% on RA  L chest wall TTP without crepitus or deformity  Abdominal: Soft. Normal appearance and bowel sounds are normal. She exhibits no distension. There is no tenderness. There is no rigidity, no rebound and no guarding.  Morbidly obese abdomen which limits exam, but overall soft NTND, +BS throughout, no r/g/r  Musculoskeletal: Normal range of motion.  MAE x4 Strength and sensation grossly intact Distal pulses equal in all extremities 1+ pitting edema bilaterally, neg homan's sign, no skin changes  Neurological: She is alert and oriented to person, place, and time. She has normal strength. No sensory deficit.  Skin: Skin is warm, dry and intact. No rash noted.  Psychiatric: She has a normal mood and affect.  Nursing note and vitals reviewed.   ED Course  Procedures (including critical care time) Labs Review Labs Reviewed  CBC WITH DIFFERENTIAL/PLATELET - Abnormal; Notable for the following:    Neutrophils Relative % 80 (*)    Monocytes Relative 2 (*)    All other components within normal limits  BASIC METABOLIC PANEL - Abnormal; Notable for the following:    Glucose, Bld 113 (*)    All other components within normal limits  PROTIME-INR - Abnormal; Notable for the following:    Prothrombin Time 27.2 (*)    INR 2.50 (*)    All other components within normal limits  BRAIN NATRIURETIC PEPTIDE  PROTIME-INR  I-STAT TROPOININ, ED    Imaging Review Dg Chest 2 View  05/30/2014   CLINICAL DATA:  Progressive cough congestion and shortness of breath and wheezing over the past several days, history of asthmatic bronchitis, occasional smoke were  EXAM: CHEST  2 VIEW  COMPARISON:  None.  FINDINGS: The lungs are adequately inflated. The interstitial markings are coarse bilaterally. There is no alveolar infiltrate.  There is no pleural effusion. The heart and pulmonary vascularity are normal. The mediastinum is normal in width. The bony thorax is unremarkable.  IMPRESSION: Mild prominence of the interstitial markings may reflect the patient's known reactive airway disease. There is no evidence of pneumonia.   Electronically Signed   By: David  [place]   On: 05/30/2014 12:16     EKG Interpretation   Date/Time:  Thursday May 30 2014 09:18:46 EST Ventricular Rate:  90 PR Interval:  143 QRS Duration: 90 QT Interval:  365 QTC Calculation: 447 R Axis:   27 Text Interpretation:  Sinus rhythm Confirmed by WARD,  DO, KRISTEN ([id number])  on 05/30/2014 9:21:18 AM      MDM   Final diagnoses:  Asthma exacerbation  SOB (shortness of breath)    34 y.o. female with wheezing, chest tightness which is reproducible on exam. Hx of antiphospholipid syndrome on lifetime coumadin. Diffuse wheezing on exam after multiple nebs, solumedrol, and magnesium. Will perform continuous nebs. Doubt PE given lack of vital sign changes,  SpO2 96-98% on RA, no tachycardia, and the fact that pain is reproducible. If INR subtherapeutic could consider CTA but again doubt that CP is from PE. EKG with NSR. Will obtain labs and CXR. Will give morphine for pain, but will hold on giving ASA or NTG since pt is anticoagulated and already has normal BP therefore don't want to risk hypotension with NTG. Will give continuous nebs and attempt to ambulate. If no improvement, will need admission for what seems to be asthma exacerbation.   12:21 PM INR 2.5 therefore therapeutic. Trop neg. CBC w/diff WNL. BMP WNL. BNP WNL. CXR with interstitial markings c/w RAD. Pt's lung sounds greatly improved, oxygenating well. Pain fully resolved. Will ambulate.  1:12 PM Failed ambulation, became very tachycardic and had increased WOB, recurrence of SOB and wheezing. Will admit.  1:21 PM Dr. Edsel PetrinMaryann Mikhail returning page, will admit to obs med/surg bed.  Admission orders placed. Please see her note for further documentation of care.  BP 164/83 mmHg  Pulse 97  Temp(Src) 97.3 F (36.3 C) (Oral)  Resp 21  Ht 5\' 2"  (1.575 m)  Wt 373 lb (169.192 kg)  BMI 68.21 kg/m2  SpO2 100%  LMP 05/09/2014  Meds ordered this encounter  Medications  . albuterol (PROVENTIL,VENTOLIN) solution continuous neb    Sig:   . morphine 4 MG/ML injection 4 mg    Sig:      Donnita FallsMercedes Strupp Camprubi-Soms, PA-C 05/30/14 1321  Kristen N Ward, DO 05/30/14 1536

## 2014-05-30 NOTE — Progress Notes (Addendum)
ANTICOAGULATION CONSULT NOTE - Initial Consult  Pharmacy Consult for warfarin Indication: Continuation of therapy for Antiphospholipid syndrome  No Known Allergies  Patient Measurements: Height: 5\' 2"  (157.5 cm) Weight: (!) 373 lb (169.192 kg) IBW/kg (Calculated) : 50.1 Heparin Dosing Weight:   Vital Signs: Temp: 97.3 F (36.3 C) (02/25 0915) Temp Source: Oral (02/25 0915) BP: 147/75 mmHg (02/25 1501) Pulse Rate: 101 (02/25 1501)  Labs:  Recent Labs  05/30/14 1002 05/30/14 1110  HGB 12.2  --   HCT 37.3  --   PLT PLATELET CLUMPS NOTED ON SMEAR, COUNT APPEARS ADEQUATE  --   LABPROT BLOOD/ANTICOAG RATIO IN TUBE UNSATISFACTORY 27.2*  INR BLOOD/ANTICOAG RATIO IN TUBE UNSATISFACTORY 2.50*  CREATININE 0.68  --     Estimated Creatinine Clearance: 154.3 mL/min (by C-G formula based on Cr of 0.68).   Medical History: Past Medical History  Diagnosis Date  . Blind   . Blind   . Anti-phospholipid antibody syndrome   . Hypertension   . Asthma     Assessment: 89 YOF presents with shortness of breath and chest pressure from suspected asthma exacerbation.  Pharmacy asked to resume warfarin dosing.   Admission INR -  2.50  Home dose - warfarin 8mg  MWF and 4mg  on TTSS (last dose 2/25)  Today, 05/30/2014:  INR = 2.5  CBC: Hgb = 12.2, pltc clumped  Drug-Drug interactions: steroids may reduce warfarin needs  Diet: cardiac diet  Goal of Therapy:  INR 2-3   Plan:   No warfarin dose needed tonight as took dose today and INR therapeutic  Daily INR  Monitor for bleeding  Juliette Alcideustin Hafsa Lohn, PharmD, BCPS.   Pager: [phone]  05/30/2014,4:10 PM

## 2014-05-30 NOTE — ED Notes (Signed)
Bed: WA11 Expected date:  Expected time:  Means of arrival:  Comments: EMS 

## 2014-05-30 NOTE — ED Notes (Signed)
PA notified of difficulty obtaining IV. Order change for IM med admin

## 2014-05-31 DIAGNOSIS — R0789 Other chest pain: Secondary | ICD-10-CM | POA: Diagnosis not present

## 2014-05-31 DIAGNOSIS — E669 Obesity, unspecified: Secondary | ICD-10-CM | POA: Diagnosis present

## 2014-05-31 DIAGNOSIS — D6861 Antiphospholipid syndrome: Secondary | ICD-10-CM | POA: Diagnosis present

## 2014-05-31 DIAGNOSIS — D72829 Elevated white blood cell count, unspecified: Secondary | ICD-10-CM | POA: Diagnosis present

## 2014-05-31 DIAGNOSIS — J0100 Acute maxillary sinusitis, unspecified: Secondary | ICD-10-CM | POA: Diagnosis not present

## 2014-05-31 DIAGNOSIS — H548 Legal blindness, as defined in USA: Secondary | ICD-10-CM | POA: Diagnosis present

## 2014-05-31 DIAGNOSIS — J019 Acute sinusitis, unspecified: Secondary | ICD-10-CM | POA: Diagnosis present

## 2014-05-31 DIAGNOSIS — Z720 Tobacco use: Secondary | ICD-10-CM | POA: Diagnosis not present

## 2014-05-31 DIAGNOSIS — I2699 Other pulmonary embolism without acute cor pulmonale: Secondary | ICD-10-CM | POA: Diagnosis present

## 2014-05-31 DIAGNOSIS — F17210 Nicotine dependence, cigarettes, uncomplicated: Secondary | ICD-10-CM | POA: Diagnosis present

## 2014-05-31 DIAGNOSIS — R000 Tachycardia, unspecified: Secondary | ICD-10-CM | POA: Diagnosis present

## 2014-05-31 DIAGNOSIS — J45901 Unspecified asthma with (acute) exacerbation: Secondary | ICD-10-CM | POA: Diagnosis not present

## 2014-05-31 DIAGNOSIS — T380X5A Adverse effect of glucocorticoids and synthetic analogues, initial encounter: Secondary | ICD-10-CM | POA: Diagnosis present

## 2014-05-31 DIAGNOSIS — I10 Essential (primary) hypertension: Secondary | ICD-10-CM | POA: Diagnosis present

## 2014-05-31 DIAGNOSIS — R0602 Shortness of breath: Secondary | ICD-10-CM | POA: Diagnosis not present

## 2014-05-31 DIAGNOSIS — I340 Nonrheumatic mitral (valve) insufficiency: Secondary | ICD-10-CM | POA: Diagnosis present

## 2014-05-31 DIAGNOSIS — Z6841 Body Mass Index (BMI) 40.0 and over, adult: Secondary | ICD-10-CM | POA: Diagnosis not present

## 2014-05-31 DIAGNOSIS — Z7901 Long term (current) use of anticoagulants: Secondary | ICD-10-CM | POA: Diagnosis not present

## 2014-05-31 LAB — CBC
HCT: 37.4 % (ref 36.0–46.0)
HEMOGLOBIN: 12.2 g/dL (ref 12.0–15.0)
MCH: 27.1 pg (ref 26.0–34.0)
MCHC: 32.6 g/dL (ref 30.0–36.0)
MCV: 83.1 fL (ref 78.0–100.0)
Platelets: 421 10*3/uL — ABNORMAL HIGH (ref 150–400)
RBC: 4.5 MIL/uL (ref 3.87–5.11)
RDW: 15.2 % (ref 11.5–15.5)
WBC: 13.6 10*3/uL — ABNORMAL HIGH (ref 4.0–10.5)

## 2014-05-31 LAB — BASIC METABOLIC PANEL
ANION GAP: 7 (ref 5–15)
BUN: 11 mg/dL (ref 6–23)
CO2: 28 mmol/L (ref 19–32)
CREATININE: 0.72 mg/dL (ref 0.50–1.10)
Calcium: 9.3 mg/dL (ref 8.4–10.5)
Chloride: 103 mmol/L (ref 96–112)
GFR calc Af Amer: >90 mL/min (ref >90)
Glucose, Bld: 187 mg/dL — ABNORMAL HIGH (ref 70–99)
POTASSIUM: 4.4 mmol/L (ref 3.5–5.1)
Sodium: 138 mmol/L (ref 135–145)

## 2014-05-31 LAB — GLUCOSE, CAPILLARY: Glucose-Capillary: 194 mg/dL — ABNORMAL HIGH (ref 70–99)

## 2014-05-31 LAB — TROPONIN I: Troponin I: <0.03 ng/mL (ref <0.031)

## 2014-05-31 LAB — D-DIMER, QUANTITATIVE (NOT AT ARMC): D-Dimer, Quant: <0.27 ug/mL-FEU (ref 0.00–0.48)

## 2014-05-31 LAB — PROTIME-INR
INR: 2.48 — AB (ref 0.00–1.49)
Prothrombin Time: 27 seconds — ABNORMAL HIGH (ref 11.6–15.2)

## 2014-05-31 MED ORDER — ALBUTEROL SULFATE (2.5 MG/3ML) 0.083% IN NEBU: 2.5000 mg | INHALATION_SOLUTION | Freq: Once | RESPIRATORY_TRACT | Status: DC

## 2014-05-31 MED ORDER — WARFARIN SODIUM 4 MG PO TABS
8.0000 mg | ORAL_TABLET | Freq: Once | ORAL | Status: AC
  Administered 2014-05-31: 8 mg via ORAL
  Filled 2014-05-31 (×2): qty 2

## 2014-05-31 MED ORDER — NORETHINDRONE-ETH ESTRADIOL 1-35 MG-MCG PO TABS: 1.0000 | ORAL_TABLET | Freq: Every day | ORAL | Status: DC

## 2014-05-31 MED ORDER — METHYLPREDNISOLONE SODIUM SUCC 40 MG IJ SOLR
40.0000 mg | Freq: Two times a day (BID) | INTRAMUSCULAR | Status: DC
  Administered 2014-05-31 (×2): 40 mg via INTRAVENOUS
  Filled 2014-05-31 (×4): qty 1

## 2014-05-31 MED ORDER — MORPHINE SULFATE 4 MG/ML IJ SOLN
4.0000 mg | Freq: Once | INTRAMUSCULAR | Status: AC
  Administered 2014-05-31: 4 mg via INTRAMUSCULAR
  Filled 2014-05-31: qty 1

## 2014-05-31 NOTE — Progress Notes (Signed)
[facility]                                                                              Patient Demographics  Kara Zamora, is a 34 y.o. female, DOB - 06/03/1980, ZOX:[id number]  Admit date - 05/30/2014   Admitting Physician Edsel Petrin, DO  Outpatient Primary MD for the patient is No primary care provider on file.  LOS -    Chief Complaint  Patient presents with  . Shortness of Breath  . Wheezing  . Chest Pain      HPI on 05/30/2014 Kara Zamora is a 34 y.o. female with a history of antiphospholipid syndrome, legal blindness, asthma, hypertension that presented to the emergency department with complaints of shortness of breath and chest pain. Patient states that yesterday she was at work she started having chest pressure located in left side of her chest with no radiation. She states that times she feels pain behind her heart. The pain is 10/10, worse with coughing and deep breathing. Patient also complains of shortness of breath for the past several months. She has had sinus problems as well as postnasal drip and coughing for approximately 2-3 weeks. Patient also admits to smoking. Patient states she's not seen a physician that she moved down here she felt that these issues were due to her allergies. In the ER, patient given nebulizer treatments, Solu-Medrol, magnesium. Patient is PO2 is in the 96-98% on room air. Patient was then ambulated and found to be very tachycardic with shortness of breath and continued wheezing. [facility] was called for admission.  Assessment & Plan   Acute Asthma Exacerbation -CXR mild prominence of interstitial markings may reflect reactive airway disease, no evidence of pneumonia -Continue supplemental oxygen, nebulizer treatments, steroids, antitussives -Will attempt to wean oxygen today -Unlikely PE, as patient is therapeutic on coumadin, DDimer 0.27 (obtained as patient has a history of APLS and is on OCP and smokes)  Chest  pressure -Likely secondary to her asthma exacerbation -Troponins cycled and negative -Pending echocardiogram (patient does have risk factors- obese, HTN, smoking) -BNP 31 -Lipid panel: TC 170, TG 96, HDL 52, LDL 99  Possible sinusitis -Continue Augmentin and nasal spray -Has had sinus pain and pressure for 3 weeks  Tachycardia with ambulation -TSH 1.146 -DDimer negative, unlikely PE (on coumadin as well) -Hb is stable -Possibly due to obesity -Echo pending  Antiphospholipid Syndrome -Continue warfarin per pharmacy -Currently therapeutic  Tobacco abuse -Patient counseled regarding smoking cessation   Essential hypertension -Continue lisinopril  Legally Blind   Leukocytosis -Likely secondary to steroids vs sinusitis -Patient afebrile, CXR negative for infection  Obesity, BMI 67 -Patient should establish care with a PCP and discuss lifestyle modifications, diet and exercise, once discharged  Code Status: Full  Family Communication: None at bedside  Disposition Plan: Admitted.  Pending improvement in breathing and echocardiogram.  Time Spent in minutes   30 minutes  Procedures  Echocardiogram  Consults   None  DVT Prophylaxis  Coumadin  Lab Results  Component Value Date   PLT 421* 05/31/2014    Medications  Scheduled Meds: . albuterol  2.5 mg Nebulization Once  . amoxicillin-clavulanate  1 tablet Oral Q12H  .  ipratropium-albuterol  3 mL Nebulization QID  . lisinopril  10 mg Oral Daily  . methylPREDNISolone (SOLU-MEDROL) injection  40 mg Intravenous Q12H  . norethindrone-ethinyl estradiol 1/35  1 tablet Oral Daily  . valACYclovir  500 mg Oral Daily  . warfarin  8 mg Oral ONCE-1800  . Warfarin - Pharmacist Dosing Inpatient   Does not apply q1800   Continuous Infusions:  PRN Meds:.acetaminophen **OR** acetaminophen, albuterol, HYDROcodone-acetaminophen, morphine injection, ondansetron **OR** ondansetron (ZOFRAN) IV, temazepam  Antibiotics    Anti-infectives    Start     Dose/Rate Route Frequency Ordered Stop   05/30/14 1800  valACYclovir (VALTREX) tablet 500 mg     500 mg Oral Daily 05/30/14 1544     05/30/14 1630  amoxicillin-clavulanate (AUGMENTIN) 875-125 MG per tablet 1 tablet     1 tablet Oral Every 12 hours 05/30/14 1544          Subjective:   Kara DickinsonAlisa Zamora seen and examined today.  Patient feels her breathing has improved slightly, but is very concerned about her heart. She states her sister had a mass on her heart.  She denies chest pain at this time.  Denies headache, dizziness, abdominal pain.  Objective:   Filed Vitals:   05/30/14 2230 05/31/14 0436 05/31/14 0743 05/31/14 1242  BP: 131/64 155/85  157/91  Pulse: 120 126    Temp:  98 F (36.7 C)  98.3 F (36.8 C)  TempSrc:  Oral    Resp:  22  20  Height:      Weight:  167.1 kg (368 lb 6.2 oz)    SpO2:  99% 97% 97%    Wt Readings from Last 3 Encounters:  05/31/14 167.1 kg (368 lb 6.2 oz)  04/10/14 167.831 kg (370 lb)     Intake/Output Summary (Last 24 hours) at 05/31/14 1320 Last data filed at 05/31/14 1100  Gross per 24 hour  Intake    600 ml  Output      0 ml  Net    600 ml    Exam  General: Well developed, NAD  HEENT: NCAT,mucous membranes moist.   Cardiovascular: S1 S2 auscultated, RRR  Respiratory: Diminished but clear breath sounds, minimal wheezing noted  Abdomen: Soft, obese, nontender, nondistended, + bowel sounds  Extremities: warm dry without cyanosis clubbing or edema  Neuro: AAOx3, legally blind otherwise nonfocal  Psych: Anxious, but appropriate  Data Review   Micro Results No results found for this or any previous visit (from the past 240 hour(s)).  Radiology Reports Dg Chest 2 View  05/30/2014   CLINICAL DATA:  Progressive cough congestion and shortness of breath and wheezing over the past several days, history of asthmatic bronchitis, occasional smoke were  EXAM: CHEST  2 VIEW  COMPARISON:  None.   FINDINGS: The lungs are adequately inflated. The interstitial markings are coarse bilaterally. There is no alveolar infiltrate. There is no pleural effusion. The heart and pulmonary vascularity are normal. The mediastinum is normal in width. The bony thorax is unremarkable.  IMPRESSION: Mild prominence of the interstitial markings may reflect the patient's known reactive airway disease. There is no evidence of pneumonia.   Electronically Signed   By: David  [place]   On: 05/30/2014 12:16    CBC  Recent Labs Lab 05/30/14 1002 05/31/14 0428  WBC 9.2 13.6*  HGB 12.2 12.2  HCT 37.3 37.4  PLT PLATELET CLUMPS NOTED ON SMEAR, COUNT APPEARS ADEQUATE 421*  MCV 82.5 83.1  MCH 27.0 27.1  MCHC  32.7 32.6  RDW 15.1 15.2  LYMPHSABS 1.5  --   MONOABS 0.2  --   EOSABS 0.1  --   BASOSABS 0.1  --     Chemistries   Recent Labs Lab 05/30/14 1002 05/30/14 1630 05/31/14 0428  NA 138  --  138  K 3.8  --  4.4  CL 105  --  103  CO2 21  --  28  GLUCOSE 113*  --  187*  BUN 12  --  11  CREATININE 0.68  --  0.72  CALCIUM 9.2  --  9.3  MG  --  1.8  --    ------------------------------------------------------------------------------------------------------------------ estimated creatinine clearance is 153 mL/min (by C-G formula based on Cr of 0.72). ------------------------------------------------------------------------------------------------------------------ No results for input(s): HGBA1C in the last 72 hours. ------------------------------------------------------------------------------------------------------------------  Recent Labs  05/30/14 1630  CHOL 170  HDL 52  LDLCALC 99  TRIG 96  CHOLHDL 3.3   ------------------------------------------------------------------------------------------------------------------  Recent Labs  05/30/14 1630  TSH 1.146   ------------------------------------------------------------------------------------------------------------------ No results  for input(s): VITAMINB12, FOLATE, FERRITIN, TIBC, IRON, RETICCTPCT in the last 72 hours.  Coagulation profile  Recent Labs Lab 05/30/14 1002 05/30/14 1110 05/31/14 0428  INR BLOOD/ANTICOAG RATIO IN TUBE UNSATISFACTORY 2.50* 2.48*     Recent Labs  05/31/14 0428  DDIMER <0.27    Cardiac Enzymes  Recent Labs Lab 05/30/14 1630 05/30/14 2145 05/31/14 0428  TROPONINI <0.03 <0.03 <0.03   ------------------------------------------------------------------------------------------------------------------ Invalid input(s): POCBNP    Kaiven Vester D.O. on 05/31/2014 at 1:20 PM  Between 7am to 7pm - Pager - [phone]  After 7pm go to [email or web address] - password [email or web address]  And look for the night coverage person covering for me after hours  [facility] Office  [phone]

## 2014-05-31 NOTE — Progress Notes (Signed)
UR completed 

## 2014-05-31 NOTE — Progress Notes (Signed)
Pt c/o being SOB and requested a breathing tx, so I notified respiratory. They administered a Douneb breathing treatment. Shortly there after she was still c/o of her chest being tight so I notified provider on call and an albuterol tx was ordered. Pt said she felt some better. About 0148 pt started c/o that her chest was felt tight and it was being squeezed. I notified provider on call, and a one time order of IM morphine was administered. At 0230 pt once again c/o of chest chest tightness, so I administrered another  Albuterol breathing tx.  By this point, 2 Troponin levels came back normal. Pt sleeping after the last tx. Will continue to monitor.

## 2014-05-31 NOTE — Progress Notes (Signed)
ANTICOAGULATION CONSULT NOTE - Follow up  Pharmacy Consult for warfarin Indication: Continuation of therapy for Antiphospholipid syndrome  No Known Allergies  Patient Measurements: Height: 5\' 2"  (157.5 cm) Weight: (!) 368 lb 6.2 oz (167.1 kg) IBW/kg (Calculated) : 50.1 Heparin Dosing Weight:   Vital Signs: Temp: 98 F (36.7 C) (02/26 0436) Temp Source: Oral (02/26 0436) BP: 155/85 mmHg (02/26 0436) Pulse Rate: 126 (02/26 0436)  Labs:  Recent Labs  05/30/14 1002 05/30/14 1110 05/30/14 1630 05/30/14 2145 05/31/14 0428  HGB 12.2  --   --   --  12.2  HCT 37.3  --   --   --  37.4  PLT PLATELET CLUMPS NOTED ON SMEAR, COUNT APPEARS ADEQUATE  --   --   --  421*  LABPROT BLOOD/ANTICOAG RATIO IN TUBE UNSATISFACTORY 27.2*  --   --  27.0*  INR BLOOD/ANTICOAG RATIO IN TUBE UNSATISFACTORY 2.50*  --   --  2.48*  CREATININE 0.68  --   --   --  0.72  TROPONINI  --   --  <0.03 <0.03 <0.03    Estimated Creatinine Clearance: 153 mL/min (by C-G formula based on Cr of 0.72).   Medical History: Past Medical History  Diagnosis Date  . Blind   . Blind   . Anti-phospholipid antibody syndrome   . Hypertension   . Asthma     Assessment: [age] YOF presents with shortness of breath and chest pressure from suspected asthma exacerbation.  Pharmacy asked to resume warfarin dosing.   Admission INR -  2.50  Home dose - warfarin 8mg  MWF and 4mg  on TTSS (last dose 2/25)  Significant events: 2/25: INR 2.5. Coumadin 4mg  PTA.  Today, 05/31/2014:  INR 2.48  CBC wnl  Drug-Drug interactions: steroids and broad-spectrum abx may reduce warfarin needs.   Cardiac diet, tolerating.  Goal of Therapy:  INR 2-3   Plan:   Cont home regimen for now, 8mg  today.  Daily INR  Monitor for bleeding  Charolotte Ekeom Ylianna Almanzar, PharmD, pager [phone]. 05/31/2014,12:30 PM.

## 2014-06-01 DIAGNOSIS — R0602 Shortness of breath: Secondary | ICD-10-CM | POA: Insufficient documentation

## 2014-06-01 LAB — PROTIME-INR
INR: 2.14 — AB (ref 0.00–1.49)
PROTHROMBIN TIME: 24.1 s — AB (ref 11.6–15.2)

## 2014-06-01 MED ORDER — ALBUTEROL SULFATE HFA 108 (90 BASE) MCG/ACT IN AERS: 1.0000 | INHALATION_SPRAY | Freq: Four times a day (QID) | RESPIRATORY_TRACT | Status: DC | PRN

## 2014-06-01 MED ORDER — GUAIFENESIN ER 600 MG PO TB12: 600.0000 mg | ORAL_TABLET | Freq: Two times a day (BID) | ORAL | Status: DC

## 2014-06-01 MED ORDER — WARFARIN SODIUM 4 MG PO TABS
4.0000 mg | ORAL_TABLET | Freq: Once | ORAL | Status: DC
  Filled 2014-06-01: qty 1

## 2014-06-01 MED ORDER — AMOXICILLIN-POT CLAVULANATE 875-125 MG PO TABS: 1.0000 | ORAL_TABLET | Freq: Two times a day (BID) | ORAL | Status: DC

## 2014-06-01 MED ORDER — PREDNISONE (PAK) 10 MG PO TABS: ORAL_TABLET | Freq: Every day | ORAL | Status: DC

## 2014-06-01 NOTE — Clinical Social Work Note (Signed)
CSW received a cal from MD requesting a taxi voucher for a blind pt that was discharging  CSW met with pt and provided taxi voucher to RN  .Regina Kujawa, LCSW [facility] Clinical Social Worker - Weekend Coverage cell #: [phone] 

## 2014-06-01 NOTE — Discharge Summary (Signed)
Physician Discharge Summary  Kara Zamora ZOX:[id number] DOB: [birth year] DOA: 05/30/2014  PCP: No primary care provider on file.  Admit date: 05/30/2014 Discharge date: 06/01/2014  Time spent: 45 minutes  Recommendations for Outpatient Follow-up:  Patient will be discharged home. She will need to establish care with a primary care physician. Patient to continue her medications as prescribed. Patient should abstain from tobacco use. Patient may resume activity as tolerated. Patient should follow a heart healthy diet.  Discharge Diagnoses:  Acute asthma exacerbation Chest pressure Possible sinusitis Tachycardia with ambulation and is on antiphospholipid syndrome Tobacco abuse Essential hypertension Legally blind Leukocytosis Obesity  Discharge Condition: Stable  Diet recommendation: Heart healthy  Filed Weights   05/30/14 0913 05/30/14 1956 05/31/14 0436  Weight: 169.192 kg (373 lb) 166.3 kg (366 lb 10 oz) 167.1 kg (368 lb 6.2 oz)    History of present illness:  on 05/30/2014 Kara Zamora is a 34 y.o. female with a history of antiphospholipid syndrome, legal blindness, asthma, hypertension that presented to the emergency department with complaints of shortness of breath and chest pain. Patient states that yesterday she was at work she started having chest pressure located in left side of her chest with no radiation. She states that times she feels pain behind her heart. The pain is 10/10, worse with coughing and deep breathing. Patient also complains of shortness of breath for the past several months. She has had sinus problems as well as postnasal drip and coughing for approximately 2-3 weeks. Patient also admits to smoking. Patient states she's not seen a physician that she moved down here she felt that these issues were due to her allergies. In the ER, patient given nebulizer treatments, Solu-Medrol, magnesium. Patient is PO2 is in the 96-98% on room air. Patient was then  ambulated and found to be very tachycardic with shortness of breath and continued wheezing. TR[facility] was called for admission.  Hospital Course:  Acute Asthma Exacerbation -CXR mild prominence of interstitial markings may reflect reactive airway disease, no evidence of pneumonia -Initially placed on supplemental oxygen, neb treatments, antitussives, steroids -Oxygen weaned off -Unlikely PE, as patient is therapeutic on coumadin, DDimer 0.27 (obtained as patient has a history of APLS and is on OCP and smokes) -Will discharge patient with steroid taper and antitussives  Chest pressure -Likely secondary to her asthma exacerbation -Troponins cycled and negative -patient does have risk factors- obese, HTN, smoking -BNP 31 -Lipid panel: TC 170, TG 96, HDL 52, LDL 99 -Echocardiogram: 65-70%, mild mitral regurgitation  Possible sinusitis -Continue Augmentin and nasal spray -Has had sinus pain and pressure for 3 weeks  Tachycardia with ambulation -TSH 1.146 -DDimer negative, unlikely PE (on coumadin as well) -Hb is stable -Possibly due to obesity -Echo 65-70%, mild mitral regurgitation  Antiphospholipid Syndrome -Continue warfarin per pharmacy -Currently therapeutic  Tobacco abuse -Patient counseled regarding smoking cessation   Essential hypertension -Continue lisinopril  Legally Blind   Leukocytosis -Likely secondary to steroids vs sinusitis -Patient afebrile, CXR negative for infection  Obesity, BMI 67 -Patient should establish care with a PCP and discuss lifestyle modifications, diet and exercise, once discharged  Procedures  Echocardiogram  Consults  None  Discharge Exam: Filed Vitals:   06/01/14 1327  BP: 145/80  Pulse: 107  Temp: 98.7 F (37.1 C)  Resp: 18   Exam  General: Well developed, NAD  Cardiovascular: S1 S2 auscultated, RRR  Respiratory: Clear to auscultation, minimal exp wheezing  Abdomen: Soft, obese, nontender, nondistended, + bowel  sounds  Extremities:  warm dry without cyanosis clubbing or edema  Neuro: AAOx3, legally blind otherwise nonfocal  Psych: Appropriate   Discharge Instructions      Discharge Instructions    Discharge instructions    Complete by:  As directed   Patient will be discharged home. She will need to establish care with a primary care physician. Patient to continue her medications as prescribed. Patient should abstain from tobacco use. Patient may resume activity as tolerated. Patient should follow a heart healthy diet.            Medication List    TAKE these medications        albuterol 108 (90 BASE) MCG/ACT inhaler  Commonly known as:  PROVENTIL HFA;VENTOLIN HFA  Inhale 1-2 puffs into the lungs every 6 (six) hours as needed for wheezing or shortness of breath.     amoxicillin-clavulanate 875-125 MG per tablet  Commonly known as:  AUGMENTIN  Take 1 tablet by mouth every 12 (twelve) hours.     guaiFENesin 600 MG 12 hr tablet  Commonly known as:  MUCINEX  Take 1 tablet (600 mg total) by mouth 2 (two) times daily.     lisinopril 10 MG tablet  Commonly known as:  PRINIVIL,ZESTRIL  Take 10 mg by mouth daily.     norethindrone-ethinyl estradiol 1/35 tablet  Commonly known as:  ORTHO-NOVUM, NORTREL,CYCLAFEM  Take 1 tablet by mouth daily.     predniSONE 10 MG tablet  Commonly known as:  STERAPRED UNI-PAK  Take by mouth daily. Prednisone dosing: Take  Prednisone  (4 tabs) x 3 days, then taper to  (3 tabs) x 3 days, then  (2 tabs) x 3days, then  (1 tab) x 3days, then Stop     temazepam 30 MG capsule  Commonly known as:  RESTORIL  Take 30 mg by mouth at bedtime as needed for sleep.     valACYclovir 500 MG tablet  Commonly known as:  VALTREX  Take 500 mg by mouth daily.     warfarin 4 MG tablet  Commonly known as:  COUMADIN  Take 4-8 mg by mouth daily.  on Mondays, wednesdays and fridays.  all other days       No Known Allergies Follow-up Information     Follow up with [facility]    . Schedule an appointment as soon as possible for a visit in 1 week.   Why:  Hospital followup, establish care   Contact information:   [address] [place] [address] [phone]       The results of significant diagnostics from this hospitalization (including imaging, microbiology, ancillary and laboratory) are listed below for reference.    Significant Diagnostic Studies: Dg Chest 2 View  05/30/2014   CLINICAL DATA:  Progressive cough congestion and shortness of breath and wheezing over the past several days, history of asthmatic bronchitis, occasional smoke were  EXAM: CHEST  2 VIEW  COMPARISON:  None.  FINDINGS: The lungs are adequately inflated. The interstitial markings are coarse bilaterally. There is no alveolar infiltrate. There is no pleural effusion. The heart and pulmonary vascularity are normal. The mediastinum is normal in width. The bony thorax is unremarkable.  IMPRESSION: Mild prominence of the interstitial markings may reflect the patient's known reactive airway disease. There is no evidence of pneumonia.   Electronically Signed   By: David  [place]   On: 05/30/2014 12:16    Microbiology: No results found for this or any previous  visit (from the past 240 hour(s)).   Labs: Basic Metabolic Panel:  Recent Labs Lab 05/30/14 1002 05/30/14 1630 05/31/14 0428  NA 138  --  138  K 3.8  --  4.4  CL 105  --  103  CO2 21  --  28  GLUCOSE 113*  --  187*  BUN 12  --  11  CREATININE 0.68  --  0.72  CALCIUM 9.2  --  9.3  MG  --  1.8  --   PHOS  --  1.6*  --    Liver Function Tests: No results for input(s): AST, ALT, ALKPHOS, BILITOT, PROT, ALBUMIN in the last 168 hours. No results for input(s): LIPASE, AMYLASE in the last 168 hours. No results for input(s): AMMONIA in the last 168 hours. CBC:  Recent Labs Lab 05/30/14 1002 05/31/14 0428  WBC 9.2 13.6*  NEUTROABS 7.3  --   HGB  12.2 12.2  HCT 37.3 37.4  MCV 82.5 83.1  PLT PLATELET CLUMPS NOTED ON SMEAR, COUNT APPEARS ADEQUATE 421*   Cardiac Enzymes:  Recent Labs Lab 05/30/14 1630 05/30/14 2145 05/31/14 0428  TROPONINI <0.03 <0.03 <0.03   BNP: BNP (last 3 results)  Recent Labs  05/30/14 1001  BNP 31.0    ProBNP (last 3 results) No results for input(s): PROBNP in the last 8760 hours.  CBG:  Recent Labs Lab 05/31/14 0755  GLUCAP 194*       Signed:  Edsel PetrinMIKHAIL, Dulcinea Kinser  [facility] 06/01/2014, 3:08 PM

## 2014-06-01 NOTE — Progress Notes (Signed)
ANTICOAGULATION CONSULT NOTE - Follow up  Pharmacy Consult for warfarin Indication: Continuation of therapy for Antiphospholipid syndrome  No Known Allergies  Patient Measurements: Height: 5\' 2"  (157.5 cm) Weight: (!) 368 lb 6.2 oz (167.1 kg) IBW/kg (Calculated) : 50.1 Heparin Dosing Weight:   Vital Signs: Temp: 98.3 F (36.8 C) (02/27 0533) Temp Source: Oral (02/27 0533) BP: 144/73 mmHg (02/27 0533) Pulse Rate: 90 (02/27 0533)  Labs:  Recent Labs  05/30/14 1002 05/30/14 1110 05/30/14 1630 05/30/14 2145 05/31/14 0428 06/01/14 0620  HGB 12.2  --   --   --  12.2  --   HCT 37.3  --   --   --  37.4  --   PLT PLATELET CLUMPS NOTED ON SMEAR, COUNT APPEARS ADEQUATE  --   --   --  421*  --   LABPROT BLOOD/ANTICOAG RATIO IN TUBE UNSATISFACTORY 27.2*  --   --  27.0* 24.1*  INR BLOOD/ANTICOAG RATIO IN TUBE UNSATISFACTORY 2.50*  --   --  2.48* 2.14*  CREATININE 0.68  --   --   --  0.72  --   TROPONINI  --   --  <0.03 <0.03 <0.03  --     Estimated Creatinine Clearance: 153 mL/min (by C-G formula based on Cr of 0.72).   Medical History: Past Medical History  Diagnosis Date  . Blind   . Blind   . Anti-phospholipid antibody syndrome   . Hypertension   . Asthma     Assessment: [age] YOF presents with shortness of breath and chest pressure from suspected asthma exacerbation.  Pharmacy asked to resume warfarin dosing.   Admission INR -  2.50  Home dose - warfarin 8mg  MWF and 4mg  on TTSS (last dose 2/25)  Significant events: 2/25: INR 2.5. Coumadin 4mg  PTA.  Today, 06/01/2014:  INR 2.14  CBC wnl  Drug-Drug interactions: steroids and broad-spectrum abx may reduce warfarin needs.   Cardiac diet, tolerating.  Goal of Therapy:  INR 2-3   Plan:   Cont home regimen for now, 4mg  today.  Daily INR  Monitor for bleeding  Arley Phenixllen Bellina Tokarczyk RPh 06/01/2014, 10:30 AM Pager [phone]

## 2014-06-01 NOTE — Progress Notes (Signed)
  Echocardiogram 2D Echocardiogram has been performed.  Aris EvertsRix, Vannessa Godown A 06/01/2014, 9:09 AM

## 2014-06-01 NOTE — Discharge Instructions (Signed)

## 2014-07-10 DIAGNOSIS — Z7901 Long term (current) use of anticoagulants: Secondary | ICD-10-CM | POA: Diagnosis not present

## 2014-07-10 DIAGNOSIS — J45909 Unspecified asthma, uncomplicated: Secondary | ICD-10-CM | POA: Diagnosis not present

## 2015-01-03 DIAGNOSIS — Z8673 Personal history of transient ischemic attack (TIA), and cerebral infarction without residual deficits: Secondary | ICD-10-CM | POA: Diagnosis not present

## 2015-01-03 DIAGNOSIS — J40 Bronchitis, not specified as acute or chronic: Secondary | ICD-10-CM | POA: Diagnosis not present

## 2015-01-03 DIAGNOSIS — E559 Vitamin D deficiency, unspecified: Secondary | ICD-10-CM | POA: Diagnosis not present

## 2015-01-03 DIAGNOSIS — H540 Blindness, both eyes: Secondary | ICD-10-CM | POA: Diagnosis not present

## 2015-01-03 DIAGNOSIS — Z23 Encounter for immunization: Secondary | ICD-10-CM | POA: Diagnosis not present

## 2015-01-03 DIAGNOSIS — D6861 Antiphospholipid syndrome: Secondary | ICD-10-CM | POA: Diagnosis not present

## 2015-01-03 DIAGNOSIS — Z7901 Long term (current) use of anticoagulants: Secondary | ICD-10-CM | POA: Diagnosis not present

## 2015-01-03 DIAGNOSIS — I10 Essential (primary) hypertension: Secondary | ICD-10-CM | POA: Diagnosis not present

## 2015-01-03 DIAGNOSIS — J45909 Unspecified asthma, uncomplicated: Secondary | ICD-10-CM | POA: Diagnosis not present

## 2015-01-03 DIAGNOSIS — R635 Abnormal weight gain: Secondary | ICD-10-CM | POA: Diagnosis not present

## 2015-01-06 DIAGNOSIS — Z7901 Long term (current) use of anticoagulants: Secondary | ICD-10-CM | POA: Diagnosis not present

## 2015-01-06 DIAGNOSIS — Z8673 Personal history of transient ischemic attack (TIA), and cerebral infarction without residual deficits: Secondary | ICD-10-CM | POA: Diagnosis not present

## 2015-03-28 DIAGNOSIS — R0602 Shortness of breath: Secondary | ICD-10-CM | POA: Diagnosis not present

## 2015-03-28 DIAGNOSIS — J45901 Unspecified asthma with (acute) exacerbation: Secondary | ICD-10-CM | POA: Diagnosis not present

## 2015-03-28 DIAGNOSIS — Z7901 Long term (current) use of anticoagulants: Secondary | ICD-10-CM | POA: Diagnosis not present

## 2015-04-01 DIAGNOSIS — Z7901 Long term (current) use of anticoagulants: Secondary | ICD-10-CM | POA: Diagnosis not present

## 2015-04-08 DIAGNOSIS — Z7901 Long term (current) use of anticoagulants: Secondary | ICD-10-CM | POA: Diagnosis not present

## 2015-04-15 ENCOUNTER — Emergency Department (HOSPITAL_COMMUNITY): Payer: Medicare Other

## 2015-04-15 ENCOUNTER — Emergency Department (HOSPITAL_COMMUNITY)
Admission: EM | Admit: 2015-04-15 | Discharge: 2015-04-15 | Disposition: A | Discharge status: Home / Self Care | Payer: Medicare Other | Attending: Emergency Medicine | Admitting: Emergency Medicine

## 2015-04-15 ENCOUNTER — Encounter (HOSPITAL_COMMUNITY): Payer: Self-pay

## 2015-04-15 DIAGNOSIS — Z8639 Personal history of other endocrine, nutritional and metabolic disease: Secondary | ICD-10-CM | POA: Diagnosis not present

## 2015-04-15 DIAGNOSIS — J45909 Unspecified asthma, uncomplicated: Secondary | ICD-10-CM | POA: Diagnosis not present

## 2015-04-15 DIAGNOSIS — R6884 Jaw pain: Secondary | ICD-10-CM | POA: Insufficient documentation

## 2015-04-15 DIAGNOSIS — Z87891 Personal history of nicotine dependence: Secondary | ICD-10-CM | POA: Diagnosis not present

## 2015-04-15 DIAGNOSIS — H540 Blindness, both eyes: Secondary | ICD-10-CM | POA: Diagnosis not present

## 2015-04-15 DIAGNOSIS — Z79899 Other long term (current) drug therapy: Secondary | ICD-10-CM | POA: Diagnosis not present

## 2015-04-15 DIAGNOSIS — Z7901 Long term (current) use of anticoagulants: Secondary | ICD-10-CM | POA: Insufficient documentation

## 2015-04-15 DIAGNOSIS — R079 Chest pain, unspecified: Secondary | ICD-10-CM | POA: Insufficient documentation

## 2015-04-15 DIAGNOSIS — R0789 Other chest pain: Secondary | ICD-10-CM | POA: Diagnosis not present

## 2015-04-15 DIAGNOSIS — I10 Essential (primary) hypertension: Secondary | ICD-10-CM | POA: Insufficient documentation

## 2015-04-15 DIAGNOSIS — Z3202 Encounter for pregnancy test, result negative: Secondary | ICD-10-CM | POA: Insufficient documentation

## 2015-04-15 LAB — DIFFERENTIAL
Basophils Absolute: 0.1 10*3/uL (ref 0.0–0.1)
Basophils Relative: 1 %
Eosinophils Absolute: 0.2 10*3/uL (ref 0.0–0.7)
Eosinophils Relative: 4 %
Lymphocytes Relative: 27 %
Lymphs Abs: 1.8 10*3/uL (ref 0.7–4.0)
MONO ABS: 0.4 10*3/uL (ref 0.1–1.0)
MONOS PCT: 6 %
NEUTROS ABS: 4.1 10*3/uL (ref 1.7–7.7)
Neutrophils Relative %: 62 %

## 2015-04-15 LAB — BASIC METABOLIC PANEL
Anion gap: 9 (ref 5–15)
BUN: 9 mg/dL (ref 6–20)
CO2: 25 mmol/L (ref 22–32)
CREATININE: 0.73 mg/dL (ref 0.44–1.00)
Calcium: 9.2 mg/dL (ref 8.9–10.3)
Chloride: 104 mmol/L (ref 101–111)
GFR calc Af Amer: >60 mL/min (ref >60)
Glucose, Bld: 119 mg/dL — ABNORMAL HIGH (ref 65–99)
Potassium: 3.9 mmol/L (ref 3.5–5.1)
SODIUM: 138 mmol/L (ref 135–145)

## 2015-04-15 LAB — CBC
HCT: 38.4 % (ref 36.0–46.0)
Hemoglobin: 12.3 g/dL (ref 12.0–15.0)
MCH: 27.5 pg (ref 26.0–34.0)
MCHC: 32 g/dL (ref 30.0–36.0)
MCV: 85.9 fL (ref 78.0–100.0)
PLATELETS: 398 10*3/uL (ref 150–400)
RBC: 4.47 MIL/uL (ref 3.87–5.11)
RDW: 14.1 % (ref 11.5–15.5)
WBC: 6.6 10*3/uL (ref 4.0–10.5)

## 2015-04-15 LAB — I-STAT TROPONIN, ED
TROPONIN I, POC: 0 ng/mL (ref 0.00–0.08)
Troponin i, poc: 0 ng/mL (ref 0.00–0.08)

## 2015-04-15 LAB — PROTIME-INR
INR: 2.08 — ABNORMAL HIGH (ref 0.00–1.49)
PROTHROMBIN TIME: 23.3 s — AB (ref 11.6–15.2)

## 2015-04-15 LAB — HCG, SERUM, QUALITATIVE: PREG SERUM: NEGATIVE

## 2015-04-15 MED ORDER — MORPHINE SULFATE (PF) 4 MG/ML IV SOLN
8.0000 mg | Freq: Once | INTRAVENOUS | Status: AC
  Administered 2015-04-15: 8 mg via INTRAMUSCULAR
  Filled 2015-04-15: qty 2

## 2015-04-15 MED ORDER — ONDANSETRON 4 MG PO TBDP
4.0000 mg | ORAL_TABLET | Freq: Once | ORAL | Status: AC
  Administered 2015-04-15: 4 mg via ORAL
  Filled 2015-04-15: qty 1

## 2015-04-15 MED ORDER — ONDANSETRON HCL 4 MG/2ML IJ SOLN: 4.0000 mg | Freq: Once | INTRAMUSCULAR | Status: DC

## 2015-04-15 MED ORDER — MORPHINE SULFATE (PF) 4 MG/ML IV SOLN: 8.0000 mg | Freq: Once | INTRAVENOUS | Status: DC

## 2015-04-15 MED ORDER — HYDROCODONE-ACETAMINOPHEN 5-325 MG PO TABS: 1.0000 | ORAL_TABLET | ORAL | Status: DC | PRN

## 2015-04-15 NOTE — ED Notes (Signed)
Patient applied clothes on herself. Patient also used cellphone without any assistance.

## 2015-04-15 NOTE — ED Provider Notes (Signed)
CSN: [id number]     Arrival date & time 04/15/15  1217 History   First MD Initiated Contact with Patient 04/15/15 1617     Chief Complaint  Patient presents with  . Chest Pain  . Jaw Pain     (Consider location/radiation/quality/duration/timing/severity/associated sxs/prior Treatment) HPI 35 year old female who presents with chest pressure and left facial pain. History of antiphospholipid syndrome, asthma, and hypertension. On coumadin. States that five days ago, she developed left sided facial pain spontaneously at rest. Pain over left cheek, worse with cold foods/fluids and worse when initially biting down. No facial swelling or redness. Pain not necessary worse with chewing. No fevers or chills.   Then noticed that she had left sided chest pressure. Pain has been constant, with no major alleviating or aggravating factors.  Not associated with exertion or position changes. Not pleuritic and without shortness of breath, lightheadedness or syncope, cough, or sputum production. No recent fever or chills. Not had pain like this before. No lower extremity edema, DOE, orthopnea, or PND. No heavy lifting or trauma.    Past Medical History  Diagnosis Date  . Blind   . Blind   . Anti-phospholipid antibody syndrome (HCC)   . Hypertension   . Asthma    History reviewed. No pertinent past surgical history. History reviewed. No pertinent family history. Social History  Substance Use Topics  . Smoking status: Former Smoker    Types: Cigarettes    Quit date: 04/04/2014  . Smokeless tobacco: Never Used  . Alcohol Use: Yes     Comment: occ.   OB History    No data available     Review of Systems 10/14 systems reviewed and are negative other than those stated in the HPI    Allergies  Review of patient's allergies indicates no known allergies.  Home Medications   Prior to Admission medications   Medication Sig Start Date End Date Taking? Authorizing Provider  albuterol (PROVENTIL  HFA;VENTOLIN HFA) 108 (90 BASE) MCG/ACT inhaler Inhale 1-2 puffs into the lungs every 6 (six) hours as needed for wheezing or shortness of breath. 06/01/14  Yes Maryann Mikhail, DO  norethindrone-ethinyl estradiol 1/35 (ORTHO-NOVUM, NORTREL,CYCLAFEM) tablet Take 1 tablet by mouth daily.   Yes Historical Provider, MD  valACYclovir (VALTREX) 500 MG tablet Take 500 mg by mouth daily.   Yes Historical Provider, MD  warfarin (COUMADIN) 4 MG tablet Take 4-8 mg by mouth daily. Take 2 tablets (8mg ) on Mon, Tues, Thurs, Fri and Take 1 tablet (4 mg) all other days.   Yes Historical Provider, MD  amoxicillin-clavulanate (AUGMENTIN) 875-125 MG per tablet Take 1 tablet by mouth every 12 (twelve) hours. Patient not taking: Reported on 04/15/2015 06/01/14   Nita Sells Mikhail, DO  guaiFENesin (MUCINEX) 600 MG 12 hr tablet Take 1 tablet (600 mg total) by mouth 2 (two) times daily. Patient not taking: Reported on 04/15/2015 06/01/14   Edsel Petrin, DO  HYDROcodone-acetaminophen (NORCO/VICODIN) 5-325 MG tablet Take 1 tablet by mouth every 4 (four) hours as needed for moderate pain or severe pain. 04/15/15   Lavera Guise, MD  predniSONE (STERAPRED UNI-PAK) 10 MG tablet Take by mouth daily. Prednisone dosing: Take  Prednisone 40mg  (4 tabs) x 3 days, then taper to 30mg  (3 tabs) x 3 days, then 20mg  (2 tabs) x 3days, then 10mg  (1 tab) x 3days, then Stop Patient not taking: Reported on 04/15/2015 06/01/14   Maryann Mikhail, DO   BP 139/64 mmHg  Pulse 71  Temp(Src) 97.5 F (36.4  C) (Oral)  Resp 18  SpO2 98%  LMP 04/01/2015 Physical Exam Physical Exam  Nursing note and vitals reviewed. Constitutional: Well developed, well nourished, non-toxic, and in no acute distress Head: Normocephalic and atraumatic.  Mouth/Throat: Oropharynx is clear and moist. Fractured premolar of the right upper jaw with no erythema or swelling, and associating cavity.  Neck: Normal range of motion. Neck supple.  Cardiovascular: Normal rate and  regular rhythm.   Pulmonary/Chest: Effort normal and breath sounds normal. No chest wall tenderness. Abdominal: Soft. There is no tenderness. There is no rebound and no guarding.  Musculoskeletal: Normal range of motion.  Neurological: Alert, no facial droop, fluent speech, moves all extremities symmetrically Skin: Skin is warm and dry.  Psychiatric: Cooperative  ED Course  Procedures (including critical care time) Labs Review Labs Reviewed  BASIC METABOLIC PANEL - Abnormal; Notable for the following:    Glucose, Bld 119 (*)    All other components within normal limits  PROTIME-INR - Abnormal; Notable for the following:    Prothrombin Time 23.3 (*)    INR 2.08 (*)    All other components within normal limits  CBC  HCG, SERUM, QUALITATIVE  DIFFERENTIAL  I-STAT TROPOININ, ED  I-STAT TROPOININ, ED    Imaging Review Dg Chest 2 View  04/15/2015  CLINICAL DATA:  Chest pain EXAM: CHEST  2 VIEW COMPARISON:  05/30/2014 FINDINGS: Normal heart size and mediastinal contours. No acute infiltrate or edema. No effusion or pneumothorax. No acute osseous findings. IMPRESSION: No active cardiopulmonary disease. Electronically Signed   By: Marnee SpringJonathon  Watts M.D.   On: 04/15/2015 13:14   I have personally reviewed and evaluated these images and lab results as part of my medical decision-making.   EKG Interpretation   Date/Time:  Tuesday April 15 2015 12:31:32 EST Ventricular Rate:  83 PR Interval:  137 QRS Duration: 86 QT Interval:  346 QTC Calculation: 406 R Axis:   43 Text Interpretation:  Sinus rhythm Baseline wander in lead(s) V2 No acute  changes Confirmed by Vahan Wadsworth MD, Annabelle HarmanANA ([id number]) on 04/15/2015 4:57:49 PM      MDM   Final diagnoses:  Chest pain, unspecified chest pain type    35 year old female with history of APS on coumadin, HTN, and asthma who presents with left facial pain and left sided chest pressure. Is non-toxic and in no acute distress. Vital signs are stable. She has  fracture tooth of right upper premolar with cavities but no associating infection, likely cause of left cheek/facial pain. No signs of infection or swelling. Cardiopulmonary exam unremarkable and remainder of exam is nonfocal. EKg is no ischemic and CXR without acute cardiopulmonary processes. Serial troponins are negative. Higher risk for ACS due to APS, but pain is atypical for that of ACS as not associated or worsened with exertion and and ongoing for 5 days with negative EKG and troponin. Is therapeutic on warfarin; thus, unlikely PE - also symptoms atypical and no tachycardia or dyspnea. At this time, does not seem serious cause of symptoms. She will follow-up with PCP this week for re-evaluation and further testing as needed. Strict return and follow-up instructions reviewed. She expressed understanding of all discharge instructions and felt comfortable with the plan of care.     Lavera Guiseana Duo Quyen Cutsforth, MD 04/16/15 1440

## 2015-04-15 NOTE — Discharge Instructions (Signed)
Return without fail for worsening symptoms, including worsening pain, difficulty breathing, passing out or feeling lightheaded, or any other symptoms concerning to you.  Please call your primary care doctor and follow-up in the next 1-3 days to arrange testing for your heart, such as a stress test.  Nonspecific Chest Pain It is often hard to find the cause of chest pain. There is always a chance that your pain could be related to something serious, such as a heart attack or a blood clot in your lungs. Chest pain can also be caused by conditions that are not life-threatening. If you have chest pain, it is very important to follow up with your doctor.  HOME CARE  If you were prescribed an antibiotic medicine, finish it all even if you start to feel better.  Avoid any activities that cause chest pain.  Do not use any tobacco products, including cigarettes, chewing tobacco, or electronic cigarettes. If you need help quitting, ask your doctor.  Do not drink alcohol.  Take medicines only as told by your doctor.  Keep all follow-up visits as told by your doctor. This is important. This includes any further testing if your chest pain does not go away.  Your doctor may tell you to keep your head raised (elevated) while you sleep.  Make lifestyle changes as told by your doctor. These may include:  Getting regular exercise. Ask your doctor to suggest some activities that are safe for you.  Eating a heart-healthy diet. Your doctor or a diet specialist (dietitian) can help you to learn healthy eating options.  Maintaining a healthy weight.  Managing diabetes, if necessary.  Reducing stress. GET HELP IF:  Your chest pain does not go away, even after treatment.  You have a rash with blisters on your chest.  You have a fever. GET HELP RIGHT AWAY IF:  Your chest pain is worse.  You have an increasing cough, or you cough up blood.  You have severe belly (abdominal) pain.  You feel  extremely weak.  You pass out (faint).  You have chills.  You have sudden, unexplained chest discomfort.  You have sudden, unexplained discomfort in your arms, back, neck, or jaw.  You have shortness of breath at any time.  You suddenly start to sweat, or your skin gets clammy.  You feel nauseous.  You vomit.  You suddenly feel light-headed or dizzy.  Your heart begins to beat quickly, or it feels like it is skipping beats. These symptoms may be an emergency. Do not wait to see if the symptoms will go away. Get medical help right away. Call your local emergency services (911 in the [place] Do not drive yourself to the hospital.   This information is not intended to replace advice given to you by your health care provider. Make sure you discuss any questions you have with your health care provider.   Document Released: 09/08/2007 Document Revised: 04/12/2014 Document Reviewed: 10/26/2013 Elsevier Interactive Patient Education [employer]2016 Elsevier Inc.

## 2015-04-15 NOTE — ED Notes (Addendum)
Patient states she has no one to pick her up and it is the social worker to find her a way home. Social Worker notified.

## 2015-04-15 NOTE — ED Notes (Addendum)
Per EMS, pt from home.  Pt c/o jaw pain radiating to chest.  No EKG in route.   Pain started x 4 days ago.  Pain increases when biting down or change in hot/cold.  Pain worse in last 2 days.  Throbbing 8/10.  Vitals: 162/88, hr 94, resp 18, 96% ra. cbg 152.  Pt is legally blind.  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  Pt is due for coumadin level check today.

## 2015-04-15 NOTE — ED Notes (Signed)
IV team at bedside, able to draw blood

## 2015-04-15 NOTE — ED Notes (Signed)
Social worker gave patient a [employer] voucher. Patient escorted to the lobby.

## 2015-04-15 NOTE — ED Notes (Signed)
Writer provided pt with a Malaw[place]urkey sandwiches, orange juices, and cheese.

## 2015-05-16 DIAGNOSIS — Z7901 Long term (current) use of anticoagulants: Secondary | ICD-10-CM | POA: Diagnosis not present

## 2015-07-04 ENCOUNTER — Encounter (HOSPITAL_COMMUNITY): Payer: Self-pay

## 2015-07-04 ENCOUNTER — Emergency Department (HOSPITAL_COMMUNITY)
Admission: EM | Admit: 2015-07-04 | Discharge: 2015-07-04 | Disposition: A | Discharge status: Home / Self Care | Payer: Medicare Other | Attending: Emergency Medicine | Admitting: Emergency Medicine

## 2015-07-04 ENCOUNTER — Emergency Department (HOSPITAL_COMMUNITY): Payer: Medicare Other

## 2015-07-04 DIAGNOSIS — D6861 Antiphospholipid syndrome: Secondary | ICD-10-CM | POA: Insufficient documentation

## 2015-07-04 DIAGNOSIS — J45901 Unspecified asthma with (acute) exacerbation: Secondary | ICD-10-CM | POA: Diagnosis not present

## 2015-07-04 DIAGNOSIS — Z87891 Personal history of nicotine dependence: Secondary | ICD-10-CM | POA: Diagnosis not present

## 2015-07-04 DIAGNOSIS — Z7901 Long term (current) use of anticoagulants: Secondary | ICD-10-CM | POA: Insufficient documentation

## 2015-07-04 DIAGNOSIS — Z79818 Long term (current) use of other agents affecting estrogen receptors and estrogen levels: Secondary | ICD-10-CM | POA: Diagnosis not present

## 2015-07-04 DIAGNOSIS — R0602 Shortness of breath: Secondary | ICD-10-CM | POA: Diagnosis not present

## 2015-07-04 DIAGNOSIS — R000 Tachycardia, unspecified: Secondary | ICD-10-CM | POA: Diagnosis not present

## 2015-07-04 DIAGNOSIS — R110 Nausea: Secondary | ICD-10-CM | POA: Insufficient documentation

## 2015-07-04 DIAGNOSIS — J111 Influenza due to unidentified influenza virus with other respiratory manifestations: Secondary | ICD-10-CM | POA: Diagnosis not present

## 2015-07-04 DIAGNOSIS — R079 Chest pain, unspecified: Secondary | ICD-10-CM | POA: Diagnosis not present

## 2015-07-04 DIAGNOSIS — H540 Blindness, both eyes: Secondary | ICD-10-CM | POA: Insufficient documentation

## 2015-07-04 DIAGNOSIS — I10 Essential (primary) hypertension: Secondary | ICD-10-CM | POA: Insufficient documentation

## 2015-07-04 DIAGNOSIS — R05 Cough: Secondary | ICD-10-CM | POA: Diagnosis not present

## 2015-07-04 DIAGNOSIS — Z79899 Other long term (current) drug therapy: Secondary | ICD-10-CM | POA: Diagnosis not present

## 2015-07-04 LAB — URINALYSIS, ROUTINE W REFLEX MICROSCOPIC
Bilirubin Urine: NEGATIVE
Glucose, UA: NEGATIVE mg/dL
Ketones, ur: NEGATIVE mg/dL
LEUKOCYTES UA: NEGATIVE
NITRITE: NEGATIVE
PROTEIN: NEGATIVE mg/dL
SPECIFIC GRAVITY, URINE: 1.011 (ref 1.005–1.030)
pH: 6 (ref 5.0–8.0)

## 2015-07-04 LAB — CBC WITH DIFFERENTIAL/PLATELET
BASOS PCT: 0 %
Basophils Absolute: 0 10*3/uL (ref 0.0–0.1)
EOS ABS: 0.1 10*3/uL (ref 0.0–0.7)
EOS PCT: 0 %
HCT: 34.3 % — ABNORMAL LOW (ref 36.0–46.0)
HEMOGLOBIN: 11.1 g/dL — AB (ref 12.0–15.0)
Lymphocytes Relative: 12 %
Lymphs Abs: 1.9 10*3/uL (ref 0.7–4.0)
MCH: 26.9 pg (ref 26.0–34.0)
MCHC: 32.4 g/dL (ref 30.0–36.0)
MCV: 83.3 fL (ref 78.0–100.0)
MONOS PCT: 7 %
Monocytes Absolute: 1.2 10*3/uL — ABNORMAL HIGH (ref 0.1–1.0)
NEUTROS PCT: 81 %
Neutro Abs: 12.8 10*3/uL — ABNORMAL HIGH (ref 1.7–7.7)
PLATELETS: 338 10*3/uL (ref 150–400)
RBC: 4.12 MIL/uL (ref 3.87–5.11)
RDW: 14.7 % (ref 11.5–15.5)
WBC: 15.9 10*3/uL — ABNORMAL HIGH (ref 4.0–10.5)

## 2015-07-04 LAB — COMPREHENSIVE METABOLIC PANEL
ALBUMIN: 3 g/dL — AB (ref 3.5–5.0)
ALK PHOS: 61 U/L (ref 38–126)
ALT: 15 U/L (ref 14–54)
ANION GAP: 8 (ref 5–15)
AST: 17 U/L (ref 15–41)
BUN: 7 mg/dL (ref 6–20)
CHLORIDE: 103 mmol/L (ref 101–111)
CO2: 23 mmol/L (ref 22–32)
Calcium: 9.1 mg/dL (ref 8.9–10.3)
Creatinine, Ser: 0.93 mg/dL (ref 0.44–1.00)
GFR calc non Af Amer: >60 mL/min (ref >60)
GLUCOSE: 107 mg/dL — AB (ref 65–99)
POTASSIUM: 3.4 mmol/L — AB (ref 3.5–5.1)
SODIUM: 134 mmol/L — AB (ref 135–145)
Total Bilirubin: 0.3 mg/dL (ref 0.3–1.2)
Total Protein: 6.1 g/dL — ABNORMAL LOW (ref 6.5–8.1)

## 2015-07-04 LAB — BRAIN NATRIURETIC PEPTIDE: B NATRIURETIC PEPTIDE 5: 35.5 pg/mL (ref 0.0–100.0)

## 2015-07-04 LAB — D-DIMER, QUANTITATIVE: D-Dimer, Quant: <0.27 ug/mL-FEU (ref 0.00–0.50)

## 2015-07-04 LAB — INFLUENZA PANEL BY PCR (TYPE A & B)
H1N1 flu by pcr: NOT DETECTED
INFLAPCR: NEGATIVE
INFLBPCR: NEGATIVE

## 2015-07-04 LAB — URINE MICROSCOPIC-ADD ON

## 2015-07-04 LAB — PROTIME-INR
INR: 2.97 — AB (ref 0.00–1.49)
PROTHROMBIN TIME: 30.4 s — AB (ref 11.6–15.2)

## 2015-07-04 LAB — TROPONIN I: Troponin I: <0.03 ng/mL (ref <0.031)

## 2015-07-04 MED ORDER — PREDNISONE 20 MG PO TABS: 60.0000 mg | ORAL_TABLET | Freq: Every day | ORAL | Status: DC

## 2015-07-04 MED ORDER — ALBUTEROL (5 MG/ML) CONTINUOUS INHALATION SOLN
10.0000 mg/h | INHALATION_SOLUTION | Freq: Once | RESPIRATORY_TRACT | Status: AC
  Administered 2015-07-04: 10 mg/h via RESPIRATORY_TRACT
  Filled 2015-07-04: qty 20

## 2015-07-04 MED ORDER — ONDANSETRON HCL 4 MG/2ML IJ SOLN
4.0000 mg | Freq: Once | INTRAMUSCULAR | Status: AC
  Administered 2015-07-04: 4 mg via INTRAVENOUS
  Filled 2015-07-04: qty 2

## 2015-07-04 MED ORDER — MORPHINE SULFATE (PF) 4 MG/ML IV SOLN
4.0000 mg | Freq: Once | INTRAVENOUS | Status: AC
Start: 2015-07-04 — End: 2015-07-04
  Administered 2015-07-04: 4 mg via INTRAVENOUS
  Filled 2015-07-04: qty 1

## 2015-07-04 MED ORDER — ONDANSETRON HCL 4 MG PO TABS: 4.0000 mg | ORAL_TABLET | Freq: Four times a day (QID) | ORAL | Status: DC

## 2015-07-04 MED ORDER — MORPHINE SULFATE (PF) 4 MG/ML IV SOLN
4.0000 mg | Freq: Once | INTRAVENOUS | Status: AC
  Administered 2015-07-04: 4 mg via INTRAVENOUS
  Filled 2015-07-04: qty 1

## 2015-07-04 MED ORDER — OSELTAMIVIR PHOSPHATE 75 MG PO CAPS: 75.0000 mg | ORAL_CAPSULE | Freq: Two times a day (BID) | ORAL | Status: DC

## 2015-07-04 MED ORDER — PREDNISONE 20 MG PO TABS
60.0000 mg | ORAL_TABLET | Freq: Once | ORAL | Status: AC
  Administered 2015-07-04: 60 mg via ORAL
  Filled 2015-07-04: qty 3

## 2015-07-04 NOTE — ED Notes (Signed)
Pt from home. Pt states that at work started feeling nausea and chills. Pt is also feeling SOB.

## 2015-07-04 NOTE — Discharge Instructions (Signed)

## 2015-07-04 NOTE — ED Provider Notes (Signed)
CSN: [id number]     Arrival date & time 07/04/15  0106 History  By signing my name below, I, Kara Zamora, attest that this documentation has been prepared under the direction and in the presence of Gilda Creasehristopher J Pollina, MD. Electronically Signed: Bethel BornBritney Zamora, ED Scribe. 07/04/2015. 6:08 AM   Chief Complaint  Patient presents with  . Chest Pain   The history is provided by the patient. No language interpreter was used.   Kara Zamora is a 35 y.o. female with history of asthma, antiphospholipid antibody syndrome on coumadin,  and HTN who presents to the Emergency Department complaining of new, pressure-like, 8/10 in severity, generalized chest pain with onset today at work. The pain is elicited with breathing and coughing. Associated symptoms include chills, cough, SOB, and nausea. Pt denies vomiting and diarrhea.   Past Medical History  Diagnosis Date  . Blind   . Blind   . Anti-phospholipid antibody syndrome (HCC)   . Hypertension   . Asthma    History reviewed. No pertinent past surgical history. History reviewed. No pertinent family history. Social History  Substance Use Topics  . Smoking status: Former Smoker    Types: Cigarettes    Quit date: 04/04/2014  . Smokeless tobacco: Never Used  . Alcohol Use: Yes     Comment: occ.   OB History    No data available     Review of Systems  Constitutional: Positive for chills.  Respiratory: Positive for cough and shortness of breath.   Cardiovascular: Positive for chest pain.  Gastrointestinal: Positive for nausea. Negative for vomiting and diarrhea.  All other systems reviewed and are negative.  Allergies  Review of patient's allergies indicates no known allergies.  Home Medications   Prior to Admission medications   Medication Sig Start Date End Date Taking? Authorizing Provider  albuterol (PROVENTIL HFA;VENTOLIN HFA) 108 (90 BASE) MCG/ACT inhaler Inhale 1-2 puffs into the lungs every 6 (six) hours as needed for  wheezing or shortness of breath. 06/01/14   Maryann Mikhail, DO  amoxicillin-clavulanate (AUGMENTIN) 875-125 MG per tablet Take 1 tablet by mouth every 12 (twelve) hours. Patient not taking: Reported on 04/15/2015 06/01/14   Nita SellsMaryann Mikhail, DO  guaiFENesin (MUCINEX) 600 MG 12 hr tablet Take 1 tablet (600 mg total) by mouth 2 (two) times daily. Patient not taking: Reported on 04/15/2015 06/01/14   Edsel PetrinMaryann Mikhail, DO  HYDROcodone-acetaminophen (NORCO/VICODIN) 5-325 MG tablet Take 1 tablet by mouth every 4 (four) hours as needed for moderate pain or severe pain. 04/15/15   Lavera Guiseana Duo Liu, MD  norethindrone-ethinyl estradiol 1/35 (ORTHO-NOVUM, NORTREL,CYCLAFEM) tablet Take 1 tablet by mouth daily.    Historical Provider, MD  predniSONE (STERAPRED UNI-PAK) 10 MG tablet Take by mouth daily. Prednisone dosing: Take  Prednisone 40mg  (4 tabs) x 3 days, then taper to 30mg  (3 tabs) x 3 days, then 20mg  (2 tabs) x 3days, then 10mg  (1 tab) x 3days, then Stop Patient not taking: Reported on 04/15/2015 06/01/14   Nita SellsMaryann Mikhail, DO  valACYclovir (VALTREX) 500 MG tablet Take 500 mg by mouth daily.    Historical Provider, MD  warfarin (COUMADIN) 4 MG tablet Take 4-8 mg by mouth daily. Take 2 tablets (8mg ) on Mon, Tues, Thurs, Fri and Take 1 tablet (4 mg) all other days.    Historical Provider, MD   Temp(Src) 99.1 F (37.3 C)  SpO2 98%  LMP 07/04/2015 (LMP Unknown) Physical Exam  Constitutional: She is oriented to person, place, and time. She appears well-developed and well-nourished.  No distress.  HENT:  Head: Normocephalic and atraumatic.  Right Ear: Hearing normal.  Left Ear: Hearing normal.  Nose: Nose normal.  Mouth/Throat: Oropharynx is clear and moist and mucous membranes are normal.  Eyes: Conjunctivae and EOM are normal. Pupils are equal, round, and reactive to light.  Neck: Normal range of motion. Neck supple.  Cardiovascular: Regular rhythm, S1 normal and S2 normal.  Exam reveals no gallop and no friction  rub.   No murmur heard. Pulmonary/Chest: Effort normal and breath sounds normal. No respiratory distress. She exhibits no tenderness.  Abdominal: Soft. Normal appearance and bowel sounds are normal. There is no hepatosplenomegaly. There is no tenderness. There is no rebound, no guarding, no tenderness at McBurney's point and negative Murphy's sign. No hernia.  Musculoskeletal: Normal range of motion.  Neurological: She is alert and oriented to person, place, and time. She has normal strength. No cranial nerve deficit or sensory deficit. Coordination normal. GCS eye subscore is 4. GCS verbal subscore is 5. GCS motor subscore is 6.  Skin: Skin is warm, dry and intact. No rash noted. No cyanosis.  Psychiatric: She has a normal mood and affect. Her speech is normal and behavior is normal. Thought content normal.  Nursing note and vitals reviewed.   ED Course  Procedures (including critical care time) DIAGNOSTIC STUDIES: Oxygen Saturation is 98% on RA,  normal by my interpretation.    COORDINATION OF CARE: 1:23 AM Discussed treatment plan which includes lab work, CXR, EKG with pt at bedside and pt agreed to plan.  Labs Review Labs Reviewed  CBC WITH DIFFERENTIAL/PLATELET - Abnormal; Notable for the following:    WBC 15.9 (*)    Hemoglobin 11.1 (*)    HCT 34.3 (*)    Neutro Abs 12.8 (*)    Monocytes Absolute 1.2 (*)    All other components within normal limits  COMPREHENSIVE METABOLIC PANEL - Abnormal; Notable for the following:    Sodium 134 (*)    Potassium 3.4 (*)    Glucose, Bld 107 (*)    Total Protein 6.1 (*)    Albumin 3.0 (*)    All other components within normal limits  URINALYSIS, ROUTINE W REFLEX MICROSCOPIC (NOT AT [facility]) - Abnormal; Notable for the following:    Hgb urine dipstick SMALL (*)    All other components within normal limits  PROTIME-INR - Abnormal; Notable for the following:    Prothrombin Time 30.4 (*)    INR 2.97 (*)    All other components within normal  limits  URINE MICROSCOPIC-ADD ON - Abnormal; Notable for the following:    Squamous Epithelial / LPF 6-30 (*)    Bacteria, UA RARE (*)    All other components within normal limits  TROPONIN I  BRAIN NATRIURETIC PEPTIDE  D-DIMER, QUANTITATIVE (NOT AT [facility])  INFLUENZA PANEL BY PCR (TYPE A & B, H1N1)    Imaging Review Dg Chest 2 View  07/04/2015  CLINICAL DATA:  35 year old female with chest pain and shortness of breath EXAM: CHEST  2 VIEW COMPARISON:  Radiograph dated 04/15/2015 FINDINGS: The heart size and mediastinal contours are within normal limits. Both lungs are clear. The visualized skeletal structures are unremarkable. IMPRESSION: No active cardiopulmonary disease. Electronically Signed   By: Elgie Collard M.D.   On: 07/04/2015 02:41   I have personally reviewed and evaluated these images and lab results as part of my medical decision-making.   EKG Interpretation   Date/Time:  Friday July 04 2015 01:24:28 EDT  Ventricular Rate:  115 PR Interval:  130 QRS Duration: 85 QT Interval:  300 QTC Calculation: 415 R Axis:   35 Text Interpretation:  Sinus tachycardia Borderline T abnormalities,  diffuse leads Baseline wander in lead(s) II III aVF Confirmed by POLLINA   MD, CHRISTOPHER ([id number]) on 07/04/2015 1:26:31 AM      MDM   Final diagnoses:  None  asthma flulike illness  Patient presents to the ER for evaluation of flulike symptoms and shortness of breath. She has had nausea, feeling chills and like she may have a fever. She has had cough and congestion associated with the symptoms. There has not been any vomiting or diarrhea. Patient feels weak at arrival. Patient does have a history of asthma. Some associated chest discomfort. Cardiac evaluation is negative. Patient does have antiphospholipid syndrome. INR is therapeutic. D-dimer is negative. This does not appear consistent with PE. She did have wheezing on arrival which improved with nebulizer treatment. Patient is  oxygenating well off of submental oxygen, is appropriate for outpatient management.  I personally performed the services described in this documentation, which was scribed in my presence. The recorded information has been reviewed and is accurate.    Gilda Crease, MD 07/04/15 [phone]

## 2015-09-12 DIAGNOSIS — Z7901 Long term (current) use of anticoagulants: Secondary | ICD-10-CM | POA: Diagnosis not present

## 2015-09-12 DIAGNOSIS — J45901 Unspecified asthma with (acute) exacerbation: Secondary | ICD-10-CM | POA: Diagnosis not present

## 2015-09-12 DIAGNOSIS — I10 Essential (primary) hypertension: Secondary | ICD-10-CM | POA: Diagnosis not present

## 2015-09-12 DIAGNOSIS — Z8619 Personal history of other infectious and parasitic diseases: Secondary | ICD-10-CM | POA: Diagnosis not present

## 2015-12-25 DIAGNOSIS — Z7689 Persons encountering health services in other specified circumstances: Secondary | ICD-10-CM | POA: Diagnosis not present

## 2015-12-25 DIAGNOSIS — Z7901 Long term (current) use of anticoagulants: Secondary | ICD-10-CM | POA: Diagnosis not present

## 2015-12-25 DIAGNOSIS — D6861 Antiphospholipid syndrome: Secondary | ICD-10-CM | POA: Diagnosis not present

## 2015-12-25 DIAGNOSIS — I10 Essential (primary) hypertension: Secondary | ICD-10-CM | POA: Diagnosis not present

## 2015-12-25 DIAGNOSIS — J4540 Moderate persistent asthma, uncomplicated: Secondary | ICD-10-CM | POA: Diagnosis not present

## 2015-12-25 DIAGNOSIS — Z309 Encounter for contraceptive management, unspecified: Secondary | ICD-10-CM | POA: Diagnosis not present

## 2015-12-25 DIAGNOSIS — Z8619 Personal history of other infectious and parasitic diseases: Secondary | ICD-10-CM | POA: Diagnosis not present

## 2015-12-25 DIAGNOSIS — H540 Blindness, both eyes: Secondary | ICD-10-CM | POA: Diagnosis not present

## 2016-01-02 DIAGNOSIS — D6861 Antiphospholipid syndrome: Secondary | ICD-10-CM | POA: Diagnosis not present

## 2016-01-02 DIAGNOSIS — Z7901 Long term (current) use of anticoagulants: Secondary | ICD-10-CM | POA: Diagnosis not present

## 2016-01-09 DIAGNOSIS — D6861 Antiphospholipid syndrome: Secondary | ICD-10-CM | POA: Diagnosis not present

## 2016-01-09 DIAGNOSIS — Z7901 Long term (current) use of anticoagulants: Secondary | ICD-10-CM | POA: Diagnosis not present

## 2016-01-16 DIAGNOSIS — Z7901 Long term (current) use of anticoagulants: Secondary | ICD-10-CM | POA: Diagnosis not present

## 2016-01-16 DIAGNOSIS — D6861 Antiphospholipid syndrome: Secondary | ICD-10-CM | POA: Diagnosis not present

## 2016-01-30 DIAGNOSIS — D6861 Antiphospholipid syndrome: Secondary | ICD-10-CM | POA: Diagnosis not present

## 2016-01-30 DIAGNOSIS — Z7901 Long term (current) use of anticoagulants: Secondary | ICD-10-CM | POA: Diagnosis not present

## 2016-02-05 DIAGNOSIS — J4541 Moderate persistent asthma with (acute) exacerbation: Secondary | ICD-10-CM | POA: Diagnosis not present

## 2016-02-05 DIAGNOSIS — J40 Bronchitis, not specified as acute or chronic: Secondary | ICD-10-CM | POA: Diagnosis not present

## 2016-02-05 DIAGNOSIS — Z0000 Encounter for general adult medical examination without abnormal findings: Secondary | ICD-10-CM | POA: Diagnosis not present

## 2016-02-06 DIAGNOSIS — Z0000 Encounter for general adult medical examination without abnormal findings: Secondary | ICD-10-CM | POA: Diagnosis not present

## 2016-02-16 DIAGNOSIS — Z01419 Encounter for gynecological examination (general) (routine) without abnormal findings: Secondary | ICD-10-CM | POA: Diagnosis not present

## 2016-02-16 DIAGNOSIS — J4540 Moderate persistent asthma, uncomplicated: Secondary | ICD-10-CM | POA: Insufficient documentation

## 2016-02-20 DIAGNOSIS — D6861 Antiphospholipid syndrome: Secondary | ICD-10-CM | POA: Diagnosis not present

## 2016-02-20 DIAGNOSIS — Z7901 Long term (current) use of anticoagulants: Secondary | ICD-10-CM | POA: Diagnosis not present

## 2016-03-16 DIAGNOSIS — Z7901 Long term (current) use of anticoagulants: Secondary | ICD-10-CM | POA: Diagnosis not present

## 2016-03-16 DIAGNOSIS — D6861 Antiphospholipid syndrome: Secondary | ICD-10-CM | POA: Diagnosis not present

## 2016-04-16 DIAGNOSIS — D6861 Antiphospholipid syndrome: Secondary | ICD-10-CM | POA: Diagnosis not present

## 2016-04-16 DIAGNOSIS — Z7901 Long term (current) use of anticoagulants: Secondary | ICD-10-CM | POA: Diagnosis not present

## 2016-04-25 DIAGNOSIS — R0602 Shortness of breath: Secondary | ICD-10-CM | POA: Diagnosis not present

## 2016-05-28 DIAGNOSIS — D6861 Antiphospholipid syndrome: Secondary | ICD-10-CM | POA: Diagnosis not present

## 2016-05-28 DIAGNOSIS — Z7901 Long term (current) use of anticoagulants: Secondary | ICD-10-CM | POA: Diagnosis not present

## 2016-06-25 DIAGNOSIS — Z7901 Long term (current) use of anticoagulants: Secondary | ICD-10-CM | POA: Diagnosis not present

## 2016-06-25 DIAGNOSIS — D6861 Antiphospholipid syndrome: Secondary | ICD-10-CM | POA: Diagnosis not present

## 2016-07-23 DIAGNOSIS — D6861 Antiphospholipid syndrome: Secondary | ICD-10-CM | POA: Diagnosis not present

## 2016-07-23 DIAGNOSIS — Z7901 Long term (current) use of anticoagulants: Secondary | ICD-10-CM | POA: Diagnosis not present

## 2016-08-20 DIAGNOSIS — D6861 Antiphospholipid syndrome: Secondary | ICD-10-CM | POA: Diagnosis not present

## 2016-08-20 DIAGNOSIS — Z7901 Long term (current) use of anticoagulants: Secondary | ICD-10-CM | POA: Diagnosis not present

## 2016-09-17 DIAGNOSIS — Z7901 Long term (current) use of anticoagulants: Secondary | ICD-10-CM | POA: Diagnosis not present

## 2016-09-17 DIAGNOSIS — D6861 Antiphospholipid syndrome: Secondary | ICD-10-CM | POA: Diagnosis not present

## 2016-10-22 DIAGNOSIS — Z7901 Long term (current) use of anticoagulants: Secondary | ICD-10-CM | POA: Diagnosis not present

## 2016-10-22 DIAGNOSIS — D6861 Antiphospholipid syndrome: Secondary | ICD-10-CM | POA: Diagnosis not present

## 2016-11-26 DIAGNOSIS — D6861 Antiphospholipid syndrome: Secondary | ICD-10-CM | POA: Diagnosis not present

## 2016-11-26 DIAGNOSIS — Z7901 Long term (current) use of anticoagulants: Secondary | ICD-10-CM | POA: Diagnosis not present

## 2017-01-07 DIAGNOSIS — Z7901 Long term (current) use of anticoagulants: Secondary | ICD-10-CM | POA: Diagnosis not present

## 2017-01-07 DIAGNOSIS — D6861 Antiphospholipid syndrome: Secondary | ICD-10-CM | POA: Diagnosis not present

## 2017-02-07 DIAGNOSIS — F17210 Nicotine dependence, cigarettes, uncomplicated: Secondary | ICD-10-CM | POA: Diagnosis not present

## 2017-02-07 DIAGNOSIS — M25562 Pain in left knee: Secondary | ICD-10-CM | POA: Diagnosis not present

## 2017-02-07 DIAGNOSIS — D6861 Antiphospholipid syndrome: Secondary | ICD-10-CM | POA: Diagnosis not present

## 2017-02-07 DIAGNOSIS — J45901 Unspecified asthma with (acute) exacerbation: Secondary | ICD-10-CM | POA: Diagnosis not present

## 2017-02-07 DIAGNOSIS — K5900 Constipation, unspecified: Secondary | ICD-10-CM | POA: Diagnosis not present

## 2017-02-07 DIAGNOSIS — I10 Essential (primary) hypertension: Secondary | ICD-10-CM | POA: Diagnosis not present

## 2017-02-07 DIAGNOSIS — Z13220 Encounter for screening for lipoid disorders: Secondary | ICD-10-CM | POA: Diagnosis not present

## 2017-02-07 DIAGNOSIS — Z0000 Encounter for general adult medical examination without abnormal findings: Secondary | ICD-10-CM | POA: Diagnosis not present

## 2017-02-07 DIAGNOSIS — E559 Vitamin D deficiency, unspecified: Secondary | ICD-10-CM | POA: Diagnosis not present

## 2017-02-07 DIAGNOSIS — B009 Herpesviral infection, unspecified: Secondary | ICD-10-CM | POA: Diagnosis not present

## 2017-02-08 DIAGNOSIS — E559 Vitamin D deficiency, unspecified: Secondary | ICD-10-CM | POA: Insufficient documentation

## 2017-03-04 DIAGNOSIS — Z7901 Long term (current) use of anticoagulants: Secondary | ICD-10-CM | POA: Diagnosis not present

## 2017-03-04 DIAGNOSIS — D6861 Antiphospholipid syndrome: Secondary | ICD-10-CM | POA: Diagnosis not present

## 2017-03-18 DIAGNOSIS — E559 Vitamin D deficiency, unspecified: Secondary | ICD-10-CM | POA: Diagnosis not present

## 2017-03-18 DIAGNOSIS — D6861 Antiphospholipid syndrome: Secondary | ICD-10-CM | POA: Diagnosis not present

## 2017-03-18 DIAGNOSIS — I10 Essential (primary) hypertension: Secondary | ICD-10-CM | POA: Diagnosis not present

## 2017-03-18 DIAGNOSIS — Z7901 Long term (current) use of anticoagulants: Secondary | ICD-10-CM | POA: Diagnosis not present

## 2017-03-18 DIAGNOSIS — J4540 Moderate persistent asthma, uncomplicated: Secondary | ICD-10-CM | POA: Diagnosis not present

## 2017-03-18 DIAGNOSIS — F17210 Nicotine dependence, cigarettes, uncomplicated: Secondary | ICD-10-CM | POA: Diagnosis not present

## 2017-03-31 ENCOUNTER — Encounter (HOSPITAL_COMMUNITY): Payer: Self-pay

## 2017-03-31 ENCOUNTER — Emergency Department (HOSPITAL_COMMUNITY): Payer: Medicare Other

## 2017-03-31 ENCOUNTER — Emergency Department (HOSPITAL_COMMUNITY)
Admission: EM | Admit: 2017-03-31 | Discharge: 2017-03-31 | Disposition: A | Discharge status: Home / Self Care | Payer: Medicare Other | Attending: Emergency Medicine | Admitting: Emergency Medicine

## 2017-03-31 DIAGNOSIS — J45909 Unspecified asthma, uncomplicated: Secondary | ICD-10-CM | POA: Diagnosis not present

## 2017-03-31 DIAGNOSIS — R1010 Upper abdominal pain, unspecified: Secondary | ICD-10-CM | POA: Diagnosis not present

## 2017-03-31 DIAGNOSIS — I10 Essential (primary) hypertension: Secondary | ICD-10-CM | POA: Diagnosis not present

## 2017-03-31 DIAGNOSIS — R100 Acute abdomen: Secondary | ICD-10-CM | POA: Diagnosis not present

## 2017-03-31 DIAGNOSIS — N10 Acute tubulo-interstitial nephritis: Secondary | ICD-10-CM | POA: Diagnosis not present

## 2017-03-31 DIAGNOSIS — R109 Unspecified abdominal pain: Secondary | ICD-10-CM | POA: Diagnosis not present

## 2017-03-31 DIAGNOSIS — Z87891 Personal history of nicotine dependence: Secondary | ICD-10-CM | POA: Insufficient documentation

## 2017-03-31 DIAGNOSIS — R1084 Generalized abdominal pain: Secondary | ICD-10-CM | POA: Diagnosis present

## 2017-03-31 DIAGNOSIS — R05 Cough: Secondary | ICD-10-CM | POA: Diagnosis not present

## 2017-03-31 DIAGNOSIS — N12 Tubulo-interstitial nephritis, not specified as acute or chronic: Secondary | ICD-10-CM

## 2017-03-31 DIAGNOSIS — Z79899 Other long term (current) drug therapy: Secondary | ICD-10-CM | POA: Insufficient documentation

## 2017-03-31 DIAGNOSIS — R110 Nausea: Secondary | ICD-10-CM | POA: Diagnosis not present

## 2017-03-31 DIAGNOSIS — R0602 Shortness of breath: Secondary | ICD-10-CM | POA: Diagnosis not present

## 2017-03-31 LAB — LIPASE, BLOOD: Lipase: 19 U/L (ref 11–51)

## 2017-03-31 LAB — CBC
HEMATOCRIT: 38.6 % (ref 36.0–46.0)
HEMOGLOBIN: 12.6 g/dL (ref 12.0–15.0)
MCH: 28.1 pg (ref 26.0–34.0)
MCHC: 32.6 g/dL (ref 30.0–36.0)
MCV: 86.2 fL (ref 78.0–100.0)
Platelets: 352 10*3/uL (ref 150–400)
RBC: 4.48 MIL/uL (ref 3.87–5.11)
RDW: 13.6 % (ref 11.5–15.5)
WBC: 11.1 10*3/uL — ABNORMAL HIGH (ref 4.0–10.5)

## 2017-03-31 LAB — URINALYSIS, ROUTINE W REFLEX MICROSCOPIC
Bilirubin Urine: NEGATIVE
Glucose, UA: NEGATIVE mg/dL
Ketones, ur: 5 mg/dL — AB
NITRITE: NEGATIVE
PH: 5 (ref 5.0–8.0)
Protein, ur: 30 mg/dL — AB
SPECIFIC GRAVITY, URINE: 1.021 (ref 1.005–1.030)

## 2017-03-31 LAB — PROTIME-INR
INR: 3.08
PROTHROMBIN TIME: 31.5 s — AB (ref 11.4–15.2)

## 2017-03-31 LAB — COMPREHENSIVE METABOLIC PANEL
ALBUMIN: 3.2 g/dL — AB (ref 3.5–5.0)
ALK PHOS: 73 U/L (ref 38–126)
ALT: 22 U/L (ref 14–54)
ANION GAP: 8 (ref 5–15)
AST: 17 U/L (ref 15–41)
BILIRUBIN TOTAL: 0.6 mg/dL (ref 0.3–1.2)
BUN: 9 mg/dL (ref 6–20)
CALCIUM: 9.3 mg/dL (ref 8.9–10.3)
CO2: 27 mmol/L (ref 22–32)
Chloride: 101 mmol/L (ref 101–111)
Creatinine, Ser: 0.85 mg/dL (ref 0.44–1.00)
GFR calc Af Amer: >60 mL/min (ref >60)
GFR calc non Af Amer: >60 mL/min (ref >60)
GLUCOSE: 147 mg/dL — AB (ref 65–99)
Potassium: 3.5 mmol/L (ref 3.5–5.1)
Sodium: 136 mmol/L (ref 135–145)
TOTAL PROTEIN: 7.4 g/dL (ref 6.5–8.1)

## 2017-03-31 LAB — I-STAT BETA HCG BLOOD, ED (MC, WL, AP ONLY)

## 2017-03-31 MED ORDER — HYDROCODONE-ACETAMINOPHEN 5-325 MG PO TABS: 1.0000 | ORAL_TABLET | ORAL | 0 refills | Status: DC | PRN

## 2017-03-31 MED ORDER — IOPAMIDOL (ISOVUE-300) INJECTION 61%
INTRAVENOUS | Status: AC
  Administered 2017-03-31: 100 mL
  Filled 2017-03-31: qty 100

## 2017-03-31 MED ORDER — CEPHALEXIN 500 MG PO CAPS: 500.0000 mg | ORAL_CAPSULE | Freq: Three times a day (TID) | ORAL | 0 refills | Status: DC

## 2017-03-31 MED ORDER — DEXTROSE 5 % IV SOLN
1.0000 g | Freq: Once | INTRAVENOUS | Status: AC
  Administered 2017-03-31: 1 g via INTRAVENOUS
  Filled 2017-03-31: qty 10

## 2017-03-31 MED ORDER — ONDANSETRON 8 MG PO TBDP: 8.0000 mg | ORAL_TABLET | Freq: Three times a day (TID) | ORAL | 0 refills | Status: DC | PRN

## 2017-03-31 NOTE — ED Notes (Signed)
Bed: [id number] Expected date:  Expected time:  Means of arrival:  Comments: 36 yo f abd pain, hx colitis

## 2017-03-31 NOTE — ED Notes (Signed)
Called [facility] for transport.  

## 2017-03-31 NOTE — ED Notes (Signed)
Bed: WHALA Expected date:  Expected time:  Means of arrival:  Comments: No bed. 

## 2017-03-31 NOTE — ED Provider Notes (Signed)
[facility]-EMERGENCY DEPT Provider Note   CSN: [id number] Arrival date & time: 03/31/17  0803     History   Chief Complaint Chief Complaint  Patient presents with  . Abdominal Pain    Patient was having ShOB this morning.     HPI Kara Zamora is a 36 y.o. female.  HPI 36 year old female presents to the emergency department with bilateral abdominal pain and bilateral flank pain.  She reports her symptoms began 4-5 days ago.  She denies dysuria or urinary frequency.  She reports nausea without vomiting.  Denies diarrhea.  No shortness of breath or cough at this time.  She reports some mild shortness of breath earlier that improved with bronchodilators.  She does have a history of asthma and states that feels like a mild asthma exacerbation.  No prior history of kidney stones.  She does report a history of colitis and states this feels similar.  Symptoms are moderate in severity   Past Medical History:  Diagnosis Date  . Anti-phospholipid antibody syndrome (HCC)   . Asthma   . Blind   . Blind   . Hypertension     Patient Active Problem List   Diagnosis Date Noted  . SOB (shortness of breath)   . Asthma exacerbation 05/30/2014  . Acute asthma exacerbation 05/30/2014  . Antiphospholipid syndrome (HCC) 05/30/2014  . Chest pressure 05/30/2014  . Tobacco abuse 05/30/2014  . Sinusitis, acute 05/30/2014    History reviewed. No pertinent surgical history.  OB History    No data available       Home Medications    Prior to Admission medications   Medication Sig Start Date End Date Taking? Authorizing Provider  albuterol (PROVENTIL HFA;VENTOLIN HFA) 108 (90 BASE) MCG/ACT inhaler Inhale 1-2 puffs into the lungs every 6 (six) hours as needed for wheezing or shortness of breath. Patient taking differently: Inhale 2 puffs into the lungs 4 (four) times daily.  06/01/14  Yes Mikhail, Nita SellsMaryann, DO  Cholecalciferol (VITAMIN D3) 50000 units CAPS Take 1  capsule by mouth every Friday. 03/18/17 06/04/17 Yes [provider]  norethindrone-ethinyl estradiol 1/35 (ORTHO-NOVUM, NORTREL,CYCLAFEM) tablet Take 1 tablet by mouth daily.   Yes [provider]  valACYclovir (VALTREX) 500 MG tablet Take 500 mg by mouth daily.   Yes [provider]  warfarin (COUMADIN) 4 MG tablet Take 4-8 mg by mouth daily. Take 2 tablets (8mg ) on Mon, Tues, Thurs, Fri and Take 1 tablet (4 mg) all other days per patient Instructions per [facility] 12/14 > 8mg  only M,Tu,F with 4mg  other days   Yes [provider]  cephALEXin (KEFLEX) 500 MG capsule Take 1 capsule (500 mg total) by mouth 3 (three) times daily. 03/31/17   Azalia Bilisampos, Lailah Marcelli, MD  HYDROcodone-acetaminophen (NORCO/VICODIN) 5-325 MG tablet Take 1 tablet by mouth every 4 (four) hours as needed for moderate pain. 03/31/17   Azalia Bilisampos, Jathniel Smeltzer, MD  ondansetron (ZOFRAN ODT) 8 MG disintegrating tablet Take 1 tablet (8 mg total) by mouth every 8 (eight) hours as needed for nausea or vomiting. 03/31/17   Azalia Bilisampos, Rumi Taras, MD    Family History History reviewed. No pertinent family history.  Social History Social History   Tobacco Use  . Smoking status: Former Smoker    Types: Cigarettes    Last attempt to quit: 04/04/2014    Years since quitting: 2.9  . Smokeless tobacco: Never Used  Substance Use Topics  . Alcohol use: Yes    Comment: occ.  . Drug  use: No     Allergies   Patient has no known allergies.   Review of Systems Review of Systems  All other systems reviewed and are negative.    Physical Exam Updated Vital Signs BP 140/81   Pulse 90   Temp 98.6 F (37 C) (Oral)   Resp 18   LMP 03/01/2017   SpO2 96%   Physical Exam  Constitutional: She is oriented to person, place, and time. She appears well-developed and well-nourished. No distress.  HENT:  Head: Normocephalic and atraumatic.  Eyes: EOM are normal.  Neck: Normal range of motion.  Cardiovascular: Normal rate,  regular rhythm and normal heart sounds.  Pulmonary/Chest: Effort normal and breath sounds normal.  Abdominal: Soft. She exhibits no distension.  Mild generalized abdominal tenderness.  Mild generalized flank pain bilaterally  Musculoskeletal: Normal range of motion.  Neurological: She is alert and oriented to person, place, and time.  Skin: Skin is warm and dry.  Psychiatric: She has a normal mood and affect. Judgment normal.  Nursing note and vitals reviewed.    ED Treatments / Results  Labs (all labs ordered are listed, but only abnormal results are displayed) Labs Reviewed  CBC - Abnormal; Notable for the following components:      Result Value   WBC 11.1 (*)    All other components within normal limits  COMPREHENSIVE METABOLIC PANEL - Abnormal; Notable for the following components:   Glucose, Bld 147 (*)    Albumin 3.2 (*)    All other components within normal limits  URINALYSIS, ROUTINE W REFLEX MICROSCOPIC - Abnormal; Notable for the following components:   APPearance HAZY (*)    Hgb urine dipstick MODERATE (*)    Ketones, ur 5 (*)    Protein, ur 30 (*)    Leukocytes, UA MODERATE (*)    Bacteria, UA MANY (*)    Squamous Epithelial / LPF 6-30 (*)    All other components within normal limits  PROTIME-INR - Abnormal; Notable for the following components:   Prothrombin Time 31.5 (*)    All other components within normal limits  URINE CULTURE  LIPASE, BLOOD  I-STAT BETA HCG BLOOD, ED ([facility], [facility], [facility] ONLY)    EKG  EKG Interpretation None       Radiology Dg Chest 2 View  Result Date: 03/31/2017 CLINICAL DATA:  Cough, shortness of breath. EXAM: CHEST  2 VIEW COMPARISON:  Chest x-ray dated July 04, 2015. FINDINGS: The cardiomediastinal silhouette is borderline enlarged, similar to prior study. Normal pulmonary vascularity. Moderate central peribronchial thickening. Low lung volumes with elevation of the right hemidiaphragm and right basilar subsegmental atelectasis. No  focal consolidation, pleural effusion, or pneumothorax. No acute osseous abnormality. IMPRESSION: Moderate, central peribronchial thickening, consistent with airways inflammation. Right basilar subsegmental atelectasis. Electronically Signed   By: Obie DredgeWilliam T Derry M.D.   On: 03/31/2017 10:59   Ct Abdomen Pelvis W Contrast  Result Date: 03/31/2017 CLINICAL DATA:  Abdominal pain.  History of colitis. EXAM: CT ABDOMEN AND PELVIS WITH CONTRAST TECHNIQUE: Multidetector CT imaging of the abdomen and pelvis was performed using the standard protocol following bolus administration of intravenous contrast. CONTRAST:  100mL ISOVUE-300 IOPAMIDOL (ISOVUE-300) INJECTION 61% COMPARISON:  None. FINDINGS: Lower chest: Streaky need subsegmental atelectasis at the right lung base. No focal consolidation or pleural effusion. The heart is normal in size. No pericardial effusion. The distal esophagus is grossly normal. Hepatobiliary: No focal hepatic lesions or intrahepatic biliary dilatation. The gallbladder is normal. No common bile  duct dilatation. Pancreas: No mass, inflammation or ductal dilatation. Spleen: Normal size.  No focal lesions. Adrenals/Urinary Tract: The adrenal glands are normal. Both kidneys demonstrate normal enhancement/ perfusion. No renal, ureteral or bladder calculi or mass. There is mild interstitial/inflammatory change around the anterior and medial aspect of the left kidney along with borderline enlarged retroperitoneal and pelvic lymph nodes. I do not see any definite findings for pyelonephritis or pancreatitis. Stomach/Bowel: The stomach, duodenum, small bowel and colon are unremarkable without oral contrast. No acute inflammatory changes, mass lesions or obstructive findings. The terminal ileum is normal. The appendix is normal. Vascular/Lymphatic: The aorta and branch vessels are normal. The major venous structures are patent. Borderline enlarged retroperitoneal, common iliac, external iliac and  operator lymph nodes. The 9 mm external iliac lymph node on the right side on image number 80, 11 mm operator lymph node on the right side on image number 83, 11 mm left operator lymph node on image number 84 and 9 mm left inguinal lymph node on image number 89. Reproductive: The uterus and ovaries are unremarkable. Other: No pelvic mass or adenopathy. No free pelvic fluid collections. No inguinal mass or adenopathy. No abdominal wall hernia or subcutaneous lesions. Musculoskeletal: No significant bony findings. IMPRESSION: 1. Inflammatory type changes noted around the anterior aspect of the left kidney without findings for pyelonephritis or pancreatitis. No obstructing ureteral calculi or bladder calculi. Recommend correlation with urinalysis. Possible recently passed stone or UTI. 2. Borderline retroperitoneal and pelvic lymphadenopathy. This could be inflammatory but Could not exclude an early lymphoproliferative process. Recommend clinical correlation. Electronically Signed   By: Rudie Meyer M.D.   On: 03/31/2017 11:27    Procedures Procedures (including critical care time)  Medications Ordered in ED Medications  iopamidol (ISOVUE-300) 61 % injection (100 mLs  Contrast Given 03/31/17 1019)  cefTRIAXone (ROCEPHIN) 1 g in dextrose 5 % 50 mL IVPB (0 g Intravenous Stopped 03/31/17 1526)     Initial Impression / Assessment and Plan / ED Course  I have reviewed the triage vital signs and the nursing notes.  Pertinent labs & imaging results that were available during my care of the patient were reviewed by me and considered in my medical decision making (see chart for details).     Urine culture sent.  Concerning for pyelonephritis.  IV antibiotics.  Home with standard precautions and antibiotics.  She understands return to the ER for new or worsening symptoms  Final Clinical Impressions(s) / ED Diagnoses   Final diagnoses:  Pyelonephritis    ED Discharge Orders        Ordered     cephALEXin (KEFLEX) 500 MG capsule  3 times daily     03/31/17 1515    ondansetron (ZOFRAN ODT) 8 MG disintegrating tablet  Every 8 hours PRN     03/31/17 1515    HYDROcodone-acetaminophen (NORCO/VICODIN) 5-325 MG tablet  Every 4 hours PRN     03/31/17 1515       Azalia Bilis, MD 03/31/17 1536

## 2017-03-31 NOTE — ED Triage Notes (Addendum)
Patient arrived via [facility] from home. C/o of left to right abdominal pain. Starts in left flank area. Initial pain was 8/10. Started on Saturday. Patient has a little bit of nausea. Denies vomiting and diarrhea. Wheezing in all four quadrants per ems. 10 albuterol given per ems 0.5 atrovent. 125mg  solumedrol. 50 mcg fentnyl. Blood pressure dropped from 140 to 90. Pain decreased to a 2. Patient given 200 cc of fluid and blood pressure came back to 100/60 . 137 CBG. Patient is blind and uses a walking cane. A/O X4. Patient on coumadin.

## 2017-03-31 NOTE — ED Notes (Signed)
Patient transported home via [facility]  

## 2017-04-02 LAB — URINE CULTURE: Culture: >=100000 — AB

## 2017-04-03 ENCOUNTER — Telehealth: Payer: Self-pay

## 2017-04-03 NOTE — Telephone Encounter (Signed)
Post ED Visit - Positive Culture Follow-up  Culture report reviewed by antimicrobial stewardship pharmacist:  []  Enzo BiNathan Batchelder, Pharm.D. []  Celedonio MiyamotoJeremy Frens, Pharm.D., BCPS AQ-ID []  Garvin FilaMike Maccia, Pharm.D., BCPS []  Georgina PillionElizabeth Martin, Pharm.D., BCPS []  MorganMinh Pham, [address] BoulevardPharm.D., BCPS, AAHIVP []  Estella HuskMichelle Turner, Pharm.D., BCPS, AAHIVP []  Lysle Pearlachel Rumbarger, PharmD, BCPS []  Casilda Carlsaylor Stone, PharmD, BCPS []  Pollyann SamplesAndy Johnston, PharmD, BCPS Sharin MonsEmily Sinclair Pharm D Positive urine culture Treated with Cephalexin, organism sensitive to the same and no further patient follow-up is required at this time.  Jerry CarasCullom, Cheyne Boulden Burnett 04/03/2017, 9:45 AM

## 2017-04-04 ENCOUNTER — Telehealth: Payer: Self-pay | Admitting: Emergency Medicine

## 2017-04-04 NOTE — Telephone Encounter (Signed)
Post ED Visit - Positive Culture Follow-up  Culture report reviewed by antimicrobial stewardship pharmacist:  []  Enzo BiNathan Batchelder, Pharm.D. []  Celedonio MiyamotoJeremy Frens, Pharm.D., BCPS AQ-ID [x]  Garvin FilaMike Maccia, Pharm.D., BCPS []  Georgina PillionElizabeth Martin, Pharm.D., BCPS []  Pasadena HillsMinh Pham, [address]Pharm.D., BCPS, AAHIVP []  Estella HuskMichelle Turner, Pharm.D., BCPS, AAHIVP []  Lysle Pearlachel Rumbarger, PharmD, BCPS []  Casilda Carlsaylor Stone, PharmD, BCPS []  Pollyann SamplesAndy Johnston, PharmD, BCPS  Positive urine culture Treated with cephalexin, organism sensitive to the same and no further patient follow-up is required at this time.  Berle MullMiller, Meryl Hubers 04/04/2017, 11:29 AM

## 2017-06-23 DIAGNOSIS — H547 Unspecified visual loss: Secondary | ICD-10-CM | POA: Insufficient documentation

## 2017-10-29 IMAGING — CR DG CHEST 2V
2 series · 2 of 2 positions shown · non-contrast
Comparison: 05/30/2014

CLINICAL DATA: Chest pain

EXAM:
CHEST  2 VIEW

[w chest pa]
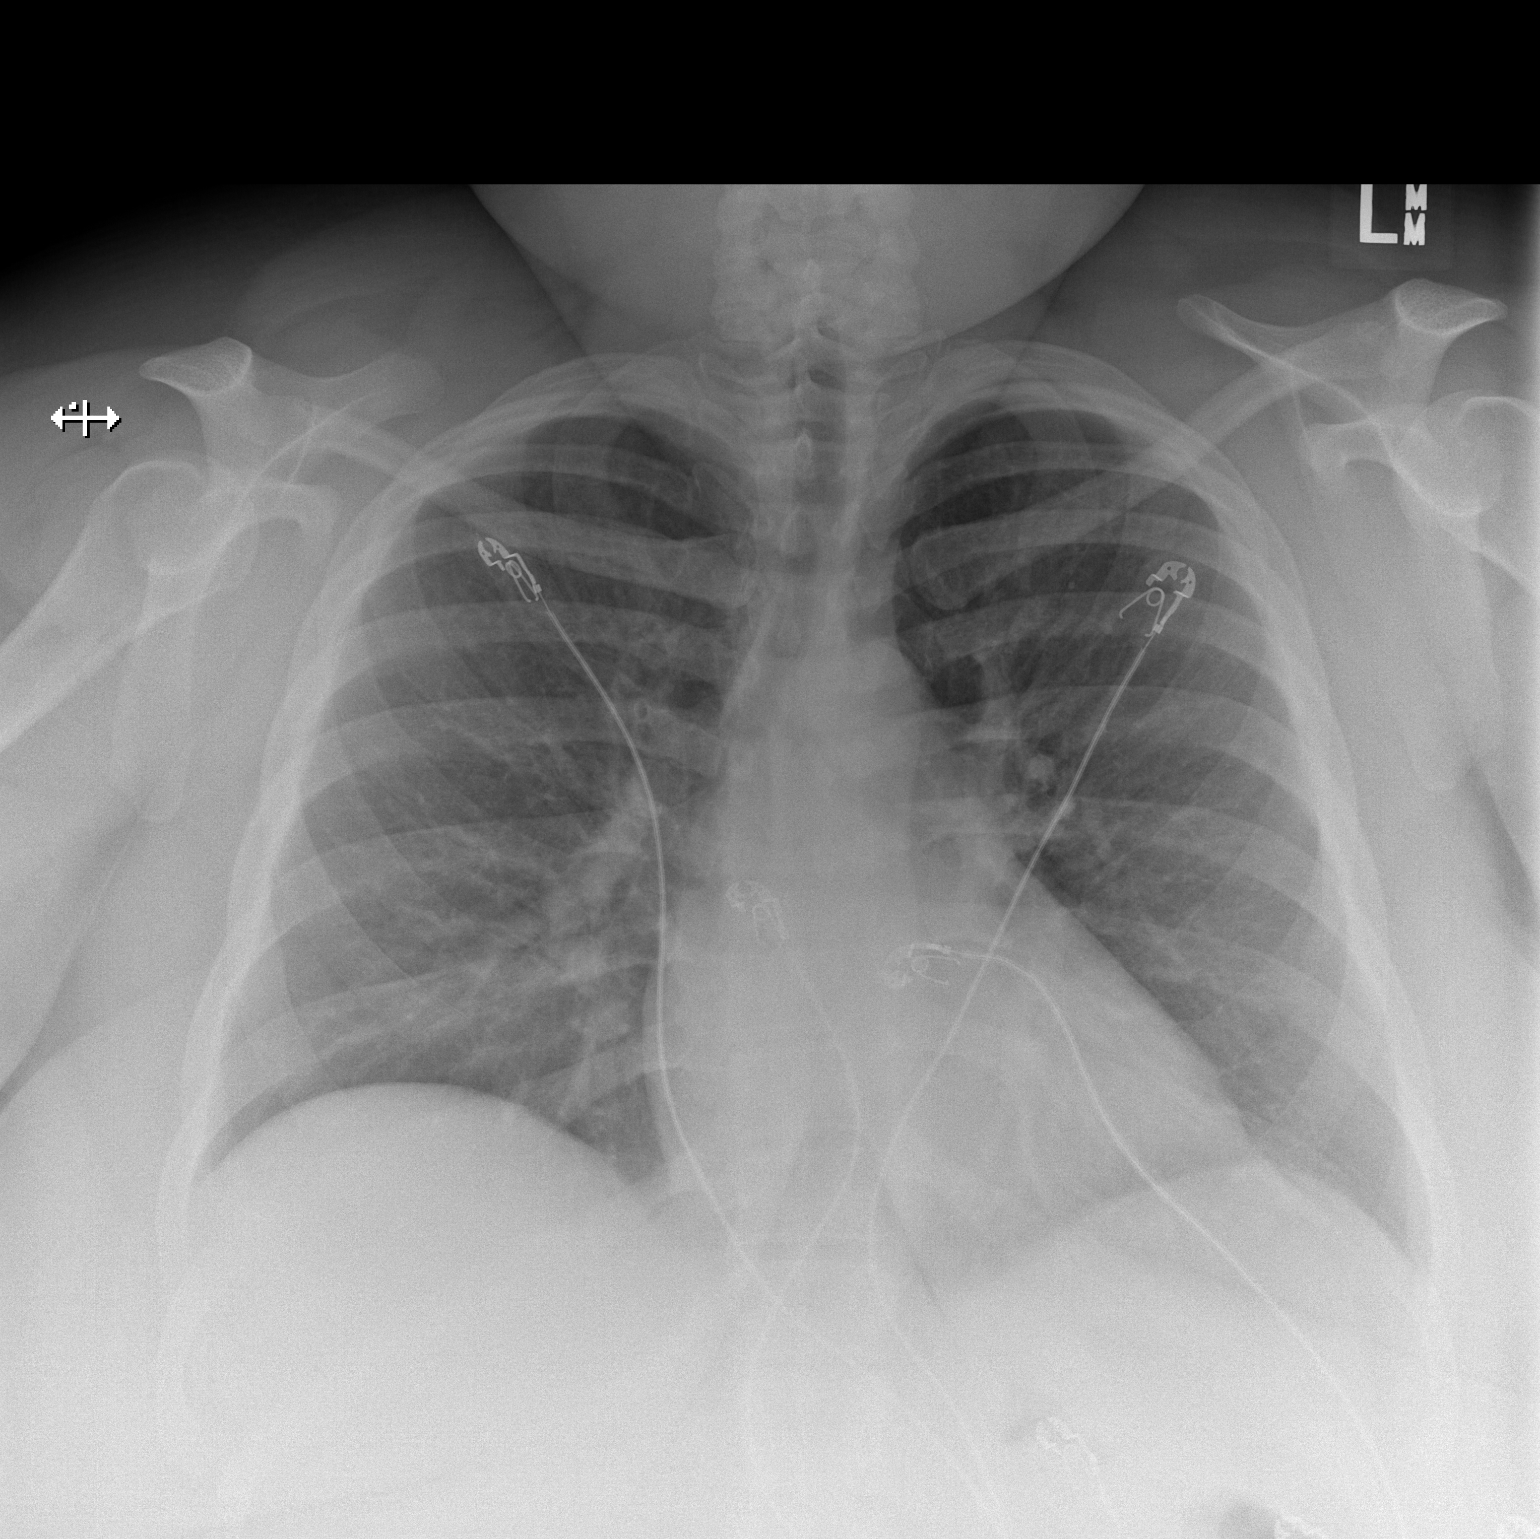

[w chest lat]
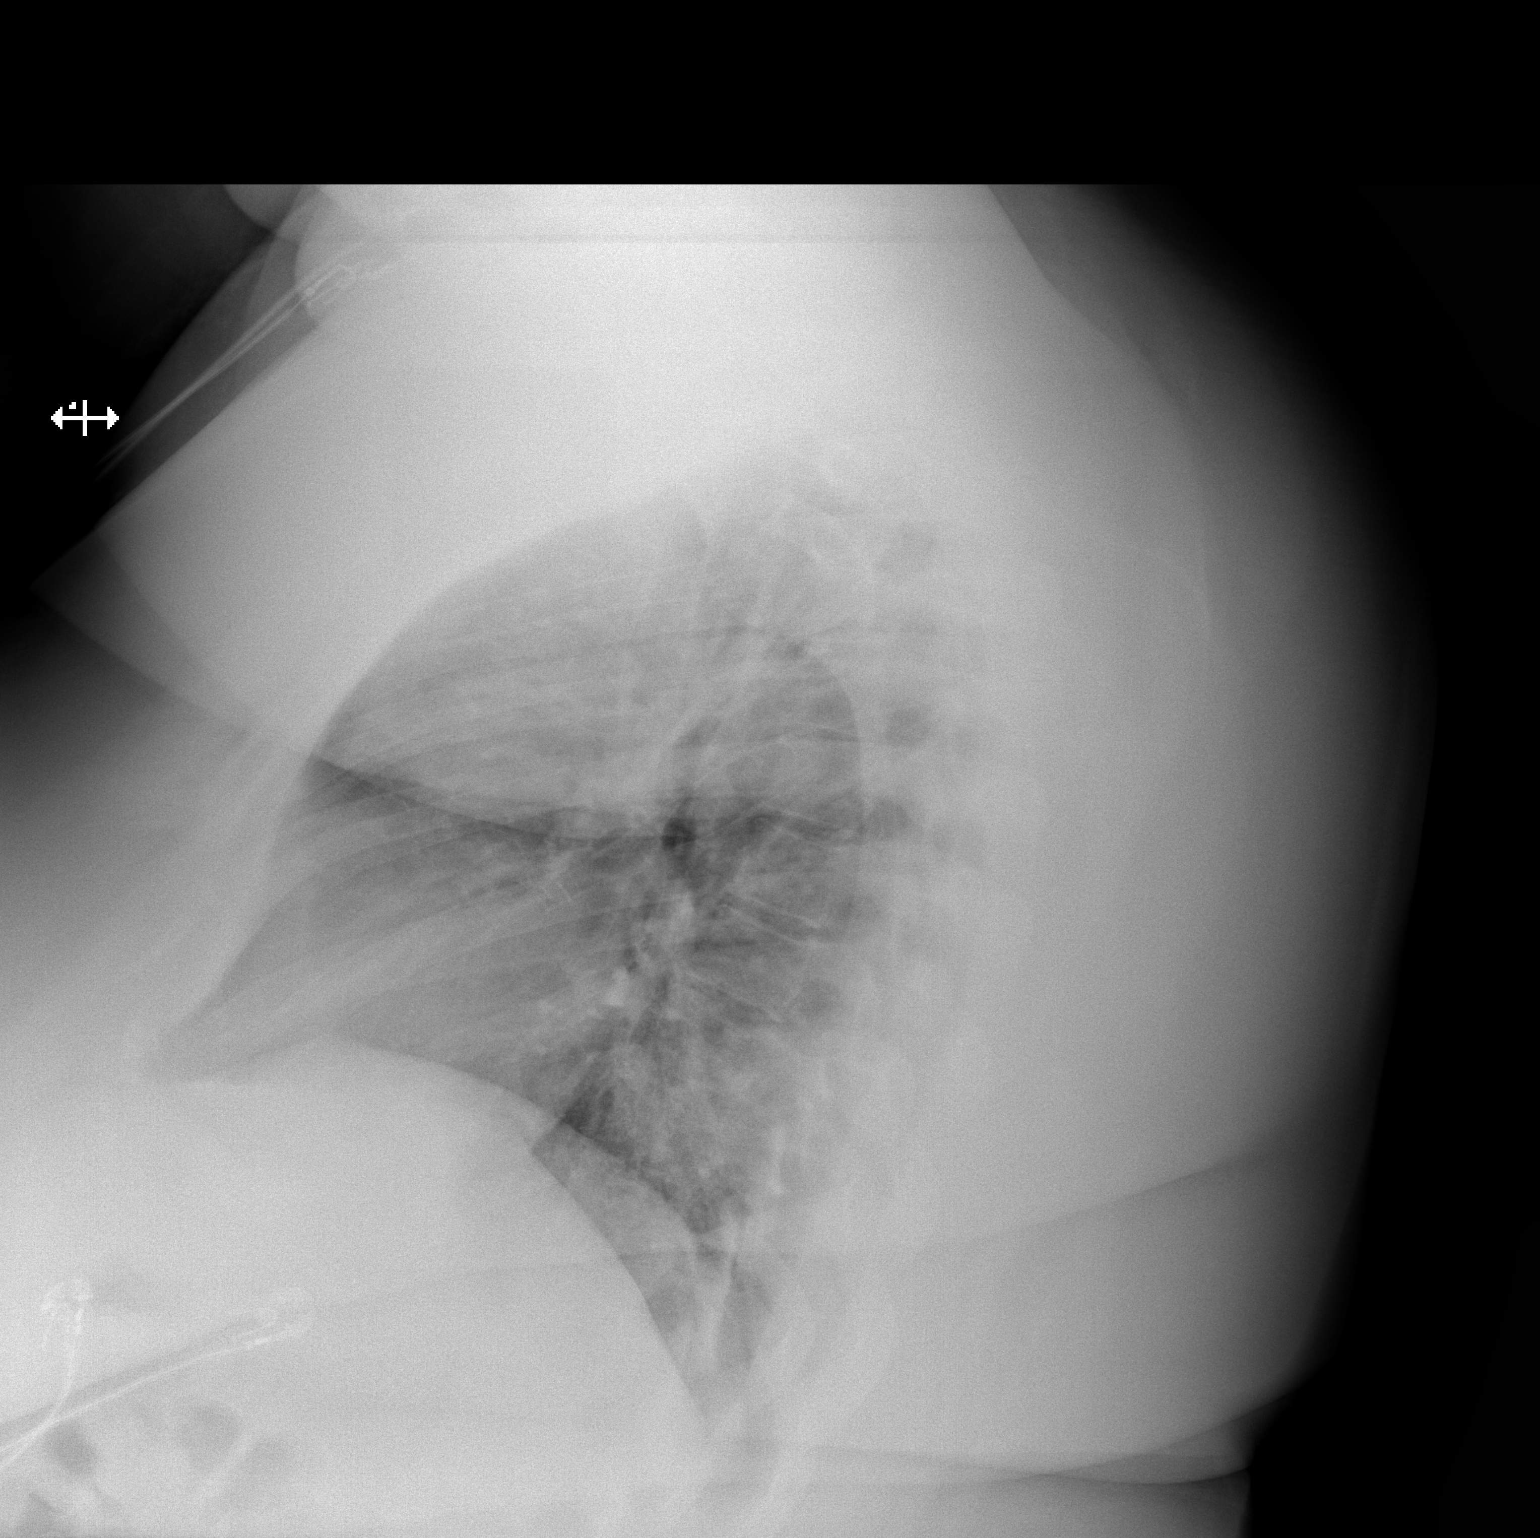

[2 of 2 positions shown; findings below may reference images not displayed]

FINDINGS: Normal heart size and mediastinal contours. No acute infiltrate or
edema. No effusion or pneumothorax. No acute osseous findings.
IMPRESSION: No active cardiopulmonary disease.

## 2018-01-17 IMAGING — CR DG CHEST 2V
2 series · 2 of 2 positions shown · non-contrast
Comparison: Radiograph dated 04/15/2015

CLINICAL DATA: 34-year-old female with chest pain and shortness of
breath

EXAM:
CHEST  2 VIEW

[chest pa]
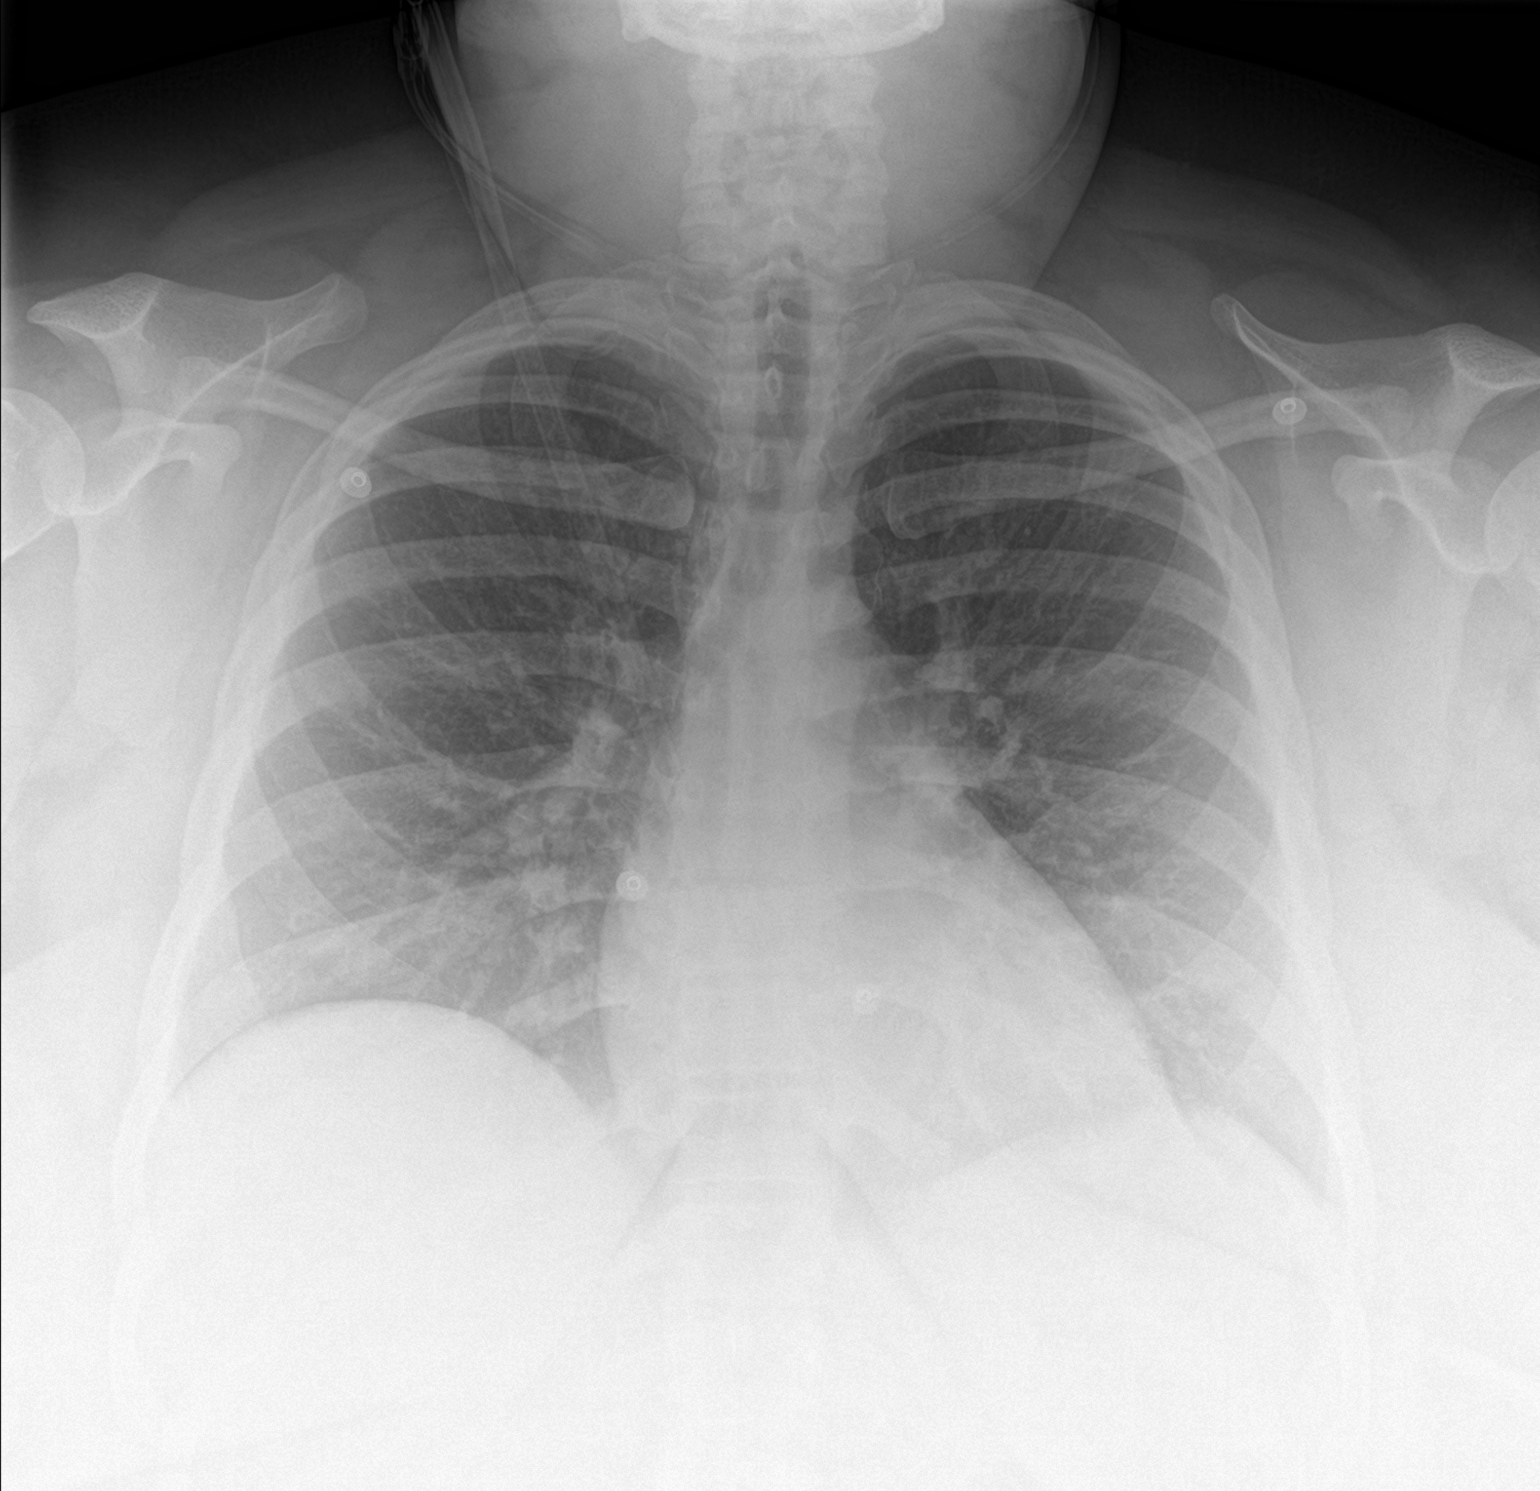

[chest lat]
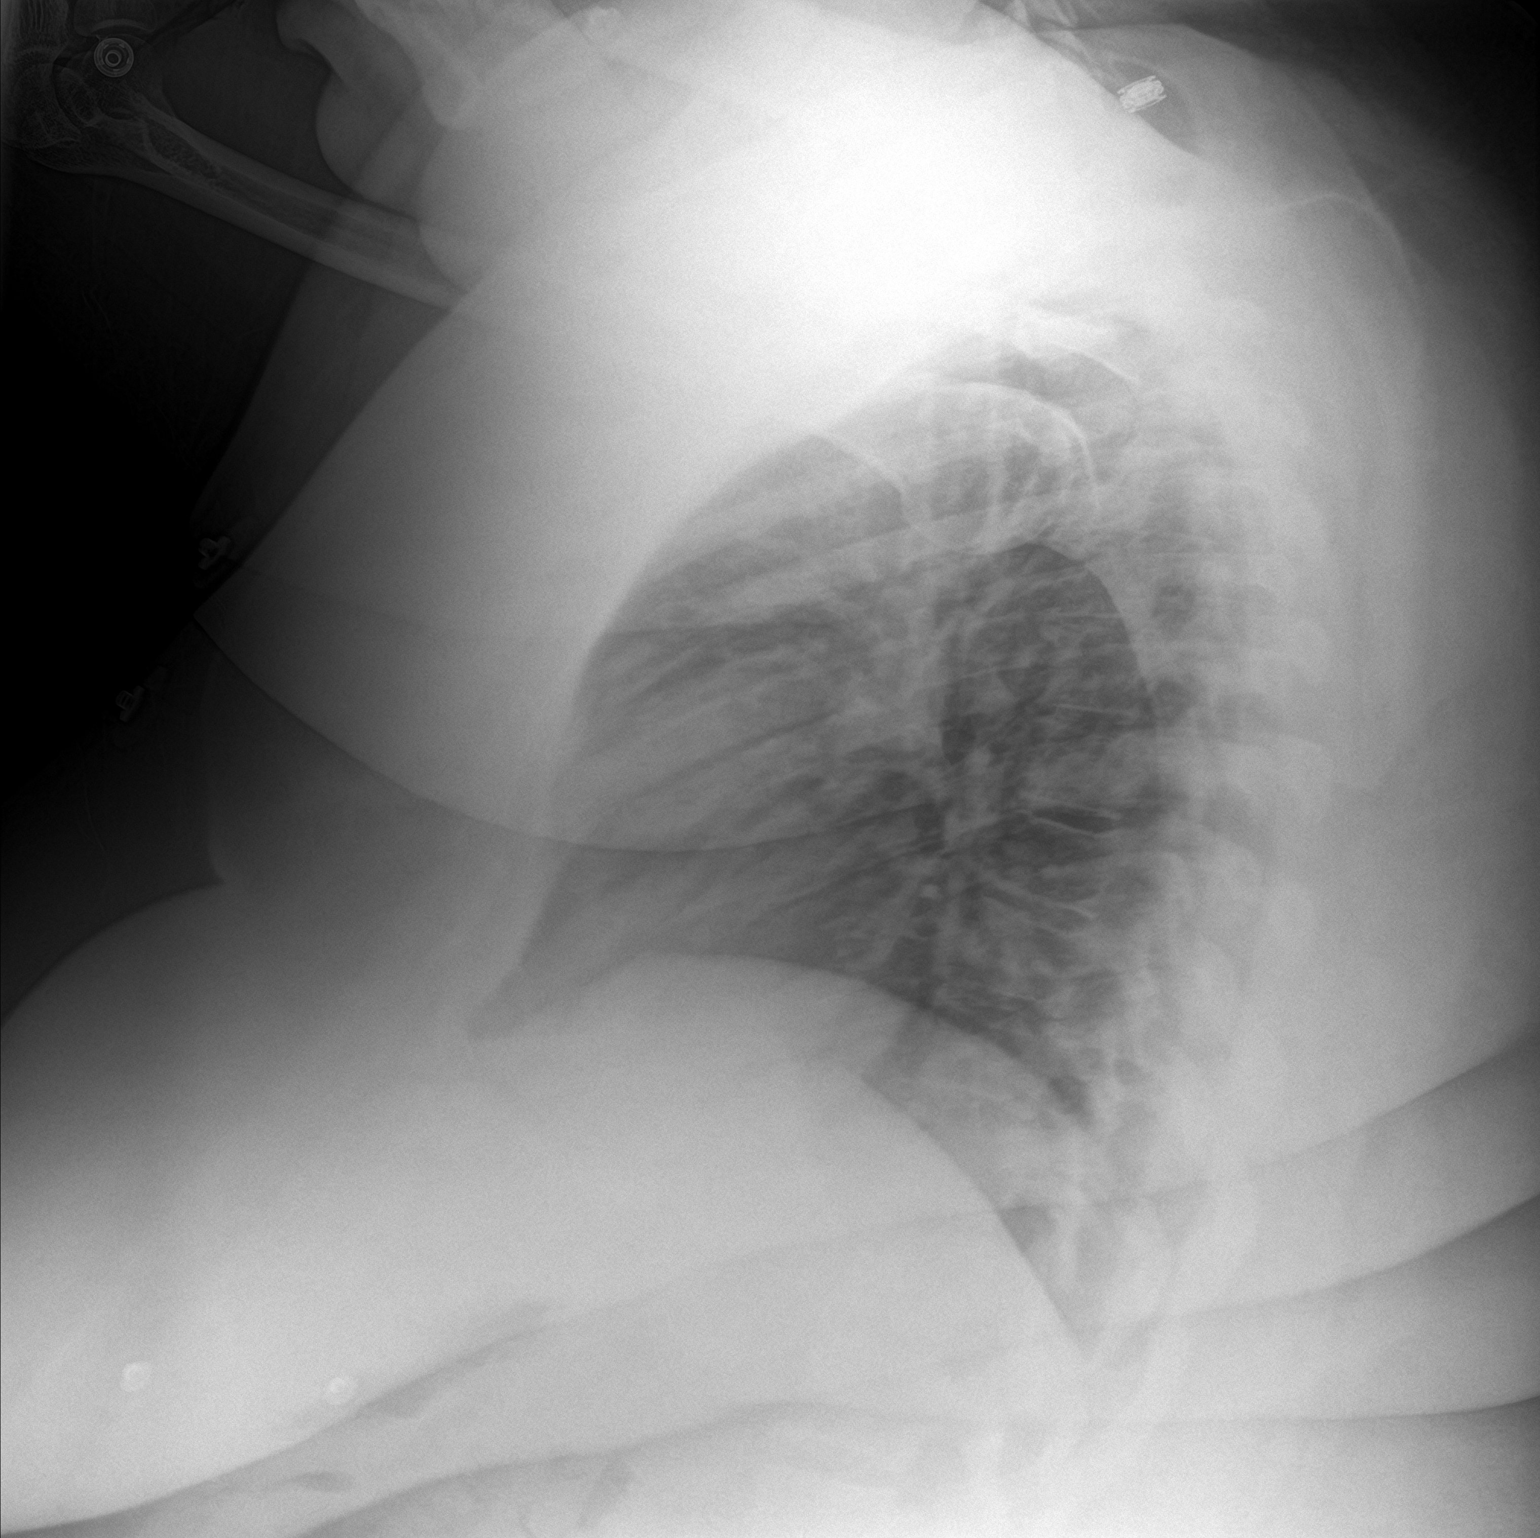

[2 of 2 positions shown; findings below may reference images not displayed]

FINDINGS: The heart size and mediastinal contours are within normal limits.
Both lungs are clear. The visualized skeletal structures are
unremarkable.
IMPRESSION: No active cardiopulmonary disease.

## 2018-05-05 DIAGNOSIS — B009 Herpesviral infection, unspecified: Secondary | ICD-10-CM | POA: Insufficient documentation

## 2018-05-05 DIAGNOSIS — F329 Major depressive disorder, single episode, unspecified: Secondary | ICD-10-CM | POA: Insufficient documentation

## 2018-08-16 ENCOUNTER — Telehealth: Payer: Self-pay | Admitting: *Deleted

## 2018-08-16 NOTE — Telephone Encounter (Signed)
REFERRAL SENT TO [facility] AND NOTES ON FILE FROM [facility] [place], [place] [phone].

## 2018-08-30 ENCOUNTER — Telehealth: Payer: Self-pay | Admitting: Cardiology

## 2018-08-30 NOTE — Telephone Encounter (Signed)
Called to pre-reg, pt states that her PCP was supposed to send new referral, says she should only be scheduled for Coumadin clinic, not Dr visit, cancellation requested 08/30/2018

## 2018-08-31 NOTE — Progress Notes (Signed)
Virtual Visit via Telephone Note   This visit type was conducted due to national recommendations for restrictions regarding the COVID-19 Pandemic (e.g. social distancing) in an effort to limit this patient's exposure and mitigate transmission in our community.  Due to her co-morbid illnesses, this patient is at least at moderate risk for complications without adequate follow up.  This format is felt to be most appropriate for this patient at this time.  The patient did not have access to video technology/had technical difficulties with video requiring transitioning to audio format only (telephone).  All issues noted in this document were discussed and addressed.  No physical exam could be performed with this format.  Please refer to the patient's chart for her  consent to telehealth for [facility].   Date:  09/01/2018   ID:  Kara Zamora, DOB [birth year], MRN [id number]  Patient Location: Home Provider Location: Home  PCP:  Thana Ates, PA-C  Cardiologist:  Rollene Rotunda, MD  Electrophysiologist:  None   Evaluation Performed:  New Patient Evaluation  Chief Complaint: Antiphospholipid antibody syndrome  History of Present Illness:    Kara Zamora is a 38 y.o. female who was referred by Fulp, Blanch Media, PA-C for evaluation of antiphospholipid antibody syndrome.  She is had her Coumadin followed in [place] but she is moved to [place].  She has had an antiphospholipid antibody syndrome since 2003.  She had a stroke with resultant blindness.  She does live independently.  She denies any cardiovascular symptoms.  She is had no chest pressure, neck or arm discomfort.  She is had no new palpitations, presyncope or syncope.  She is had no weight gain or edema.  The patient does not have symptoms concerning for COVID-19 infection (fever, chills, cough, or new shortness of breath).    Past Medical History:  Diagnosis Date  . Anti-phospholipid antibody syndrome (HCC)   .  Asthma   . Blind   . Hypertension   . Tobacco abuse 05/30/2014   Past Surgical History:  Procedure Laterality Date  . None       Current Meds  Medication Sig  . albuterol (PROVENTIL HFA;VENTOLIN HFA) 108 (90 BASE) MCG/ACT inhaler Inhale 1-2 puffs into the lungs every 6 (six) hours as needed for wheezing or shortness of breath. (Patient taking differently: Inhale 2 puffs into the lungs 4 (four) times daily. )  . buPROPion (WELLBUTRIN SR) 150 MG 12 hr tablet Take 1 tablet by mouth 2 (two) times daily.  Marland Kitchen HYDROcodone-acetaminophen (NORCO/VICODIN) 5-325 MG tablet Take 1 tablet by mouth every 4 (four) hours as needed for moderate pain.  Marland Kitchen lisinopril (ZESTRIL) 10 MG tablet Take 10 mg by mouth daily.  . norethindrone-ethinyl estradiol 1/35 (ORTHO-NOVUM, NORTREL,CYCLAFEM) tablet Take 1 tablet by mouth daily.  . traZODone (DESYREL) 50 MG tablet Take 50 mg by mouth at bedtime.  . valACYclovir (VALTREX) 500 MG tablet Take 500 mg by mouth daily.  Marland Kitchen warfarin (COUMADIN) 4 MG tablet Take 4-8 mg by mouth daily. Take 2 tablets (8mg ) on Mon, Tues, Thurs, Fri and Take 1 tablet (4 mg) all other days per patient Instructions per [facility] 12/14 > 8mg  only M,Tu,F with 4mg  other days     Allergies:   Patient has no known allergies.   Social History   Tobacco Use  . Smoking status: Former Smoker    Types: Cigarettes    Last attempt to quit: 04/04/2014    Years since quitting: 4.4  . Smokeless tobacco: Never Used  Substance Use Topics  . Alcohol use: Yes    Comment: occ.  . Drug use: No     Family Hx: The patient's family history is not on file.  ROS:   Please see the history of present illness.    As stated in the HPI and negative for all other systems.    Prior CV studies:   The following studies were reviewed today:  Care Everywhere records reviewed  Labs/Other Tests and Data Reviewed:    EKG:  No ECG reviewed.  Recent Labs: No results found for requested labs within last 8760  hours.   Recent Lipid Panel Lab Results  Component Value Date/Time   CHOL 170 05/30/2014 04:30 PM   TRIG 96 05/30/2014 04:30 PM   HDL 52 05/30/2014 04:30 PM   CHOLHDL 3.3 05/30/2014 04:30 PM   LDLCALC 99 05/30/2014 04:30 PM    Wt Readings from Last 3 Encounters:  05/31/14 (!) 368 lb 6.2 oz (167.1 kg)  04/10/14 (!) 370 lb (167.8 kg)     Objective:    Vital Signs:  There were no vitals taken for this visit.    ASSESSMENT & PLAN:    HTN:   I don't have a blood pressure machine.  She has legal blindness.  We will check this when she comes back to the office.  We might be able to get her audio blood pressure cuff.  ANTIPHOSPHOLIPID ANTIBODY SYNDROME:  We will get her established with our Coumadin clinic.  In addition I would like to get a CBC.      TOBACCO ABUSE: She has cut way back.  I applauded this and encouraged eventual complete abstinence.   Time:   Today, I have spent 25 minutes with the patient with telehealth technology discussing the above problems.     Medication Adjustments/Labs and Tests Ordered: Current medicines are reviewed at length with the patient today.  Concerns regarding medicines are outlined above.   Tests Ordered: No orders of the defined types were placed in this encounter.   Medication Changes: No orders of the defined types were placed in this encounter.   Disposition:  Follow up See me in the office in August  Signed, Zade Falkner, MD  09/01/2018 11:47 AM    [facility]

## 2018-09-01 ENCOUNTER — Encounter: Payer: Self-pay | Admitting: Cardiology

## 2018-09-01 ENCOUNTER — Telehealth: Payer: Self-pay | Admitting: Licensed Clinical Social Worker

## 2018-09-01 ENCOUNTER — Telehealth: Payer: Self-pay | Admitting: Cardiology

## 2018-09-01 ENCOUNTER — Telehealth (INDEPENDENT_AMBULATORY_CARE_PROVIDER_SITE_OTHER): Payer: Medicare HMO | Admitting: Cardiology

## 2018-09-01 DIAGNOSIS — R760 Raised antibody titer: Secondary | ICD-10-CM

## 2018-09-01 DIAGNOSIS — Z7189 Other specified counseling: Secondary | ICD-10-CM

## 2018-09-01 DIAGNOSIS — H543 Unqualified visual loss, both eyes: Secondary | ICD-10-CM | POA: Insufficient documentation

## 2018-09-01 DIAGNOSIS — Z79899 Other long term (current) drug therapy: Secondary | ICD-10-CM | POA: Insufficient documentation

## 2018-09-01 NOTE — Telephone Encounter (Signed)
CSW referred to assist patient with obtaining a BP cuff. CSW contacted patient to inform cuff will be delivered to home next week. Patient grateful for support and assistance. CSW available as needed. Jackie Daylynn Stumpp, LCSW, CCSW-MCS [phone]  

## 2018-09-01 NOTE — Telephone Encounter (Signed)
pls call

## 2018-09-01 NOTE — Telephone Encounter (Signed)
New Message:    Nanette ,[place] from [place] called. She said she would like to talk to you or Dr Antoine Poche, concerning pt's visit today.

## 2018-09-01 NOTE — Telephone Encounter (Signed)
1. COVID-19 Pre-Screening Questions:  . In the past 7 to 10 days have you had a cough,  shortness of breath, headache, congestion, fever (100 or greater) body aches, chills, sore throat, or sudden loss of taste or sense of smell? No  . Have you been around anyone with known Covid 19. No . Have you been around anyone who is awaiting Covid 19 test results in the past 7 to 10 days? No . Have you been around anyone who has been exposed to Covid 19, or has mentioned symptoms of Covid 19 within the past 7 to 10 days? No   2. Pt advised of visitor restrictions (no visitors allowed except if needed to conduct the visit). Also advised to arrive at appointment time and wear a mask.  Spoke with patient and she has been on Coumadin for some time, but has been managed at [facility]. She would like to change management to our clinic. She says it has been awhile since she was last checked.   She takes Couamdin M,T,TH,F 8mg , 4mg   All other days.

## 2018-09-01 NOTE — Patient Instructions (Signed)
Medication Instructions:  Continue current medicaions  If you need a refill on your cardiac medications before your next appointment, please call your pharmacy.  Labwork: CBC  Testing/Procedures: None Ordered  Follow-Up: You will need a follow up appointment in 3 months.  Please call our office 2 months in advance to schedule this appointment.  You may see Rollene Rotunda, MD or one of the following Advanced Practice Providers on your designated Care Team:   Theodore Demark, PA-C . Joni Reining, DNP, ANP   At [facility], you and your health needs are our priority.  As part of our continuing mission to provide you with exceptional heart care, we have created designated Provider Care Teams.  These Care Teams include your primary Cardiologist (physician) and Advanced Practice Providers (APPs -  Physician Assistants and Nurse Practitioners) who all work together to provide you with the care you need, when you need it.  Thank you for choosing [facility] at [facility]!!

## 2018-09-01 NOTE — Telephone Encounter (Signed)
Called patient, she states she should be seen as new patient for Coumadin clinic- I advised I would route a message to PharmD to help assist patient in getting scheduled, she states she was waiting for them to call her.

## 2018-09-01 NOTE — Telephone Encounter (Signed)
New Message ° ° ° °Pt is returning call  ° ° ° °Please call back  °

## 2018-09-04 ENCOUNTER — Telehealth: Payer: Self-pay

## 2018-09-04 NOTE — Telephone Encounter (Signed)
1. COVID-19 Pre-Screening Questions:  . In the past 7 to 10 days have you had a cough,  shortness of breath, headache, congestion, fever (100 or greater) body aches, chills, sore throat, or sudden loss of taste or sense of smell?no . Have you been around anyone with known Covid 19. . Have you been around anyone who is awaiting Covid 19 test results in the past 7 to 10 days?no . Have you been around anyone who has been exposed to Covid 19, or has mentioned symptoms of Covid 19 within the past 7 to 10 days?no   2. Pt advised of visitor restrictions (no visitors allowed except if needed to conduct the visit). Also advised to arrive at appointment time and wear a mask.  yes   

## 2018-09-05 ENCOUNTER — Ambulatory Visit (INDEPENDENT_AMBULATORY_CARE_PROVIDER_SITE_OTHER): Payer: Medicare HMO | Admitting: Pharmacist Clinician (PhC)/ Clinical Pharmacy Specialist

## 2018-09-05 ENCOUNTER — Telehealth: Payer: Self-pay | Admitting: Licensed Clinical Social Worker

## 2018-09-05 ENCOUNTER — Other Ambulatory Visit: Payer: Self-pay

## 2018-09-05 DIAGNOSIS — Z7901 Long term (current) use of anticoagulants: Secondary | ICD-10-CM | POA: Diagnosis not present

## 2018-09-05 DIAGNOSIS — R760 Raised antibody titer: Secondary | ICD-10-CM | POA: Diagnosis not present

## 2018-09-05 LAB — POCT INR: INR: 3.1 — AB (ref 2.0–3.0)

## 2018-09-05 NOTE — Telephone Encounter (Signed)
CSW contacted patient to inform of delivery today between 3pm-6pm of BP cuff.  Patient requested assistance with initial set up of cuff. CSW will explore options for assistance and return call to patient later today. Patient grateful for the call and denies any other concerns at this time. Lasandra Beech, LCSW, CCSW-MCS [phone]

## 2018-09-05 NOTE — Telephone Encounter (Signed)
CSW contacted patient to inform Kara Zamora from [employer] will stop by in the morning to help her get set up with the new BP cuff. Patient grateful for the assistance. Lasandra Beech, LCSW, CCSW-MCS [phone]

## 2018-09-12 ENCOUNTER — Telehealth: Payer: Self-pay

## 2018-09-12 NOTE — Telephone Encounter (Signed)
Unable to lmom due to full vm

## 2018-09-20 ENCOUNTER — Telehealth: Payer: Self-pay | Admitting: *Deleted

## 2018-09-20 NOTE — Telephone Encounter (Signed)
Unable to leave a message, mail box full. 

## 2018-09-25 ENCOUNTER — Other Ambulatory Visit: Payer: Self-pay | Admitting: Cardiology

## 2018-09-25 NOTE — Telephone Encounter (Signed)
°*  STAT* If patient is at the pharmacy, call can be transferred to refill team.   1. Which medications need to be refilled? (please list name of each medication and dose if known) warfarin (COUMADIN) 4 MG tablet  2. Which pharmacy/location (including street and city if local pharmacy) is medication to be sent to? [id number] - [place], [place] - [address]  3. Do they need a 30 day or 90 day supply? 90 days   Patient states she is without medication. This is day # 3 without meds.

## 2018-09-26 ENCOUNTER — Ambulatory Visit (INDEPENDENT_AMBULATORY_CARE_PROVIDER_SITE_OTHER): Payer: Medicare HMO | Admitting: Pharmacist Clinician (PhC)/ Clinical Pharmacy Specialist

## 2018-09-26 ENCOUNTER — Other Ambulatory Visit: Payer: Self-pay

## 2018-09-26 DIAGNOSIS — M26623 Arthralgia of bilateral temporomandibular joint: Secondary | ICD-10-CM | POA: Insufficient documentation

## 2018-09-26 DIAGNOSIS — D6861 Antiphospholipid syndrome: Secondary | ICD-10-CM

## 2018-09-26 DIAGNOSIS — Z7901 Long term (current) use of anticoagulants: Secondary | ICD-10-CM | POA: Diagnosis not present

## 2018-09-26 DIAGNOSIS — R760 Raised antibody titer: Secondary | ICD-10-CM | POA: Diagnosis not present

## 2018-09-26 LAB — POCT INR: INR: 1.1 — AB (ref 2.0–3.0)

## 2018-09-26 MED ORDER — WARFARIN SODIUM 4 MG PO TABS: ORAL_TABLET | ORAL | 1 refills | Status: DC

## 2018-09-26 NOTE — Telephone Encounter (Signed)
rx sent

## 2018-09-29 DIAGNOSIS — F419 Anxiety disorder, unspecified: Secondary | ICD-10-CM | POA: Insufficient documentation

## 2018-10-02 ENCOUNTER — Telehealth: Payer: Self-pay

## 2018-10-02 NOTE — Telephone Encounter (Signed)

## 2018-10-18 ENCOUNTER — Other Ambulatory Visit: Payer: Self-pay | Admitting: Cardiology

## 2018-10-23 ENCOUNTER — Telehealth: Payer: Self-pay

## 2018-10-23 NOTE — Telephone Encounter (Signed)

## 2018-10-27 ENCOUNTER — Ambulatory Visit (INDEPENDENT_AMBULATORY_CARE_PROVIDER_SITE_OTHER): Payer: Medicare HMO | Admitting: *Deleted

## 2018-10-27 ENCOUNTER — Other Ambulatory Visit: Payer: Self-pay

## 2018-10-27 DIAGNOSIS — R760 Raised antibody titer: Secondary | ICD-10-CM | POA: Diagnosis not present

## 2018-10-27 DIAGNOSIS — Z7901 Long term (current) use of anticoagulants: Secondary | ICD-10-CM

## 2018-10-27 LAB — POCT INR: INR: 2.6 (ref 2.0–3.0)

## 2018-11-12 ENCOUNTER — Other Ambulatory Visit: Payer: Self-pay | Admitting: Cardiology

## 2018-11-13 NOTE — Telephone Encounter (Signed)
Refill Request.  

## 2018-11-17 ENCOUNTER — Ambulatory Visit (INDEPENDENT_AMBULATORY_CARE_PROVIDER_SITE_OTHER): Payer: Medicare HMO | Admitting: *Deleted

## 2018-11-17 ENCOUNTER — Other Ambulatory Visit: Payer: Self-pay

## 2018-11-17 DIAGNOSIS — Z7901 Long term (current) use of anticoagulants: Secondary | ICD-10-CM | POA: Diagnosis not present

## 2018-11-17 DIAGNOSIS — R760 Raised antibody titer: Secondary | ICD-10-CM | POA: Diagnosis not present

## 2018-11-17 LAB — POCT INR: INR: 1.7 — AB (ref 2.0–3.0)

## 2018-11-17 NOTE — Patient Instructions (Signed)
Description   Today take 2.5 tablets ( take an extra 1/2 tablet) then Continue with 1 tablet each Sunday, Wednesday and Saturday, 2 tablets all other days.  Repeat INR in 2 weeks

## 2018-12-05 ENCOUNTER — Other Ambulatory Visit: Payer: Self-pay

## 2018-12-05 ENCOUNTER — Ambulatory Visit (INDEPENDENT_AMBULATORY_CARE_PROVIDER_SITE_OTHER): Payer: Medicare HMO | Admitting: Pharmacist

## 2018-12-05 DIAGNOSIS — R760 Raised antibody titer: Secondary | ICD-10-CM

## 2018-12-05 DIAGNOSIS — Z7901 Long term (current) use of anticoagulants: Secondary | ICD-10-CM | POA: Diagnosis not present

## 2018-12-05 LAB — POCT INR: INR: 2.5 (ref 2.0–3.0)

## 2018-12-05 MED ORDER — LISINOPRIL 10 MG PO TABS: 10.0000 mg | ORAL_TABLET | Freq: Every day | ORAL | 1 refills | Status: DC

## 2018-12-05 MED ORDER — WARFARIN SODIUM 4 MG PO TABS: ORAL_TABLET | ORAL | 0 refills | Status: DC

## 2018-12-06 ENCOUNTER — Other Ambulatory Visit: Payer: Self-pay | Admitting: Cardiology

## 2018-12-06 NOTE — Telephone Encounter (Signed)
Refill Request.  

## 2018-12-27 DIAGNOSIS — I10 Essential (primary) hypertension: Secondary | ICD-10-CM | POA: Insufficient documentation

## 2018-12-27 NOTE — Progress Notes (Unsigned)
{Choose 1 Note Type (Telehealth Visit or Telephone Visit):[phone]}   Date:  12/27/2018   ID:  Kara Zamora, DOB 1980/08/20, MRN [id number]  {Patient Location:[phone]::"Home"} {Provider Location:[phone]::"Home"}  PCP:  Thana Ates, PA-C  Cardiologist:  Rollene Rotunda, MD *** Electrophysiologist:  None   Evaluation Performed:  {Choose Visit Type:[phone]::"Follow-Up Visit"}  Chief Complaint:  ***  History of Present Illness:    Kara Zamora is a 38 y.o. female with who was referred by Fulp, Blanch Media, PA-C for evaluation of antiphospholipid antibody syndrome.  She is had her Coumadin followed in [place] but she is moved to [place].  She has had an antiphospholipid antibody syndrome since 2003.  She had a stroke with resultant blindness.  ***     She does live independently.  She denies any cardiovascular symptoms.  She is had no chest pressure, neck or arm discomfort.  She is had no new palpitations, presyncope or syncope.  She is had no weight gain or edema.  The patient {does/does not:200015} have symptoms concerning for COVID-19 infection (fever, chills, cough, or new shortness of breath).    Past Medical History:  Diagnosis Date  . Anti-phospholipid antibody syndrome (HCC)   . Asthma   . Blind   . Hypertension   . Tobacco abuse 05/30/2014   Past Surgical History:  Procedure Laterality Date  . None       No outpatient medications have been marked as taking for the 12/28/18 encounter (Appointment) with Rollene Rotunda, MD.     Allergies:   Patient has no known allergies.   Social History   Tobacco Use  . Smoking status: Former Smoker    Types: Cigarettes    Quit date: 04/04/2014    Years since quitting: 4.7  . Smokeless tobacco: Never Used  Substance Use Topics  . Alcohol use: Yes    Comment: occ.  . Drug use: No     Family Hx: The patient's family history is not on file.  ROS:   Please see the history of present illness.     *** All other systems reviewed and are negative.   Prior CV studies:   The following studies were reviewed today:  ***  Labs/Other Tests and Data Reviewed:    EKG:  {EKG/Telemetry Strips [phone]}  Recent Labs: No results found for requested labs within last 8760 hours.   Recent Lipid Panel Lab Results  Component Value Date/Time   CHOL 170 05/30/2014 04:30 PM   TRIG 96 05/30/2014 04:30 PM   HDL 52 05/30/2014 04:30 PM   CHOLHDL 3.3 05/30/2014 04:30 PM   LDLCALC 99 05/30/2014 04:30 PM    Wt Readings from Last 3 Encounters:  05/31/14 (!) 368 lb 6.2 oz (167.1 kg)  04/10/14 (!) 370 lb (167.8 kg)     Objective:    Vital Signs:  There were no vitals taken for this visit.   {[facility] Virtual Exam (Optional):[phone]::"VITAL SIGNS:  reviewed"}  ASSESSMENT & PLAN:    HTN:  ***  I don't have a blood pressure machine.  She has legal blindness.  We will check this when she comes back to the office.  We might be able to get her audio blood pressure cuff.  ANTIPHOSPHOLIPID ANTIBODY SYNDROME:  *** We will get her established with our Coumadin clinic.  In addition I would like to get a CBC.      TOBACCO ABUSE:   ***  She has cut way back.  I applauded this and encouraged eventual complete abstinence.  COVID-19 Education: The signs and symptoms of COVID-19 were discussed with the patient and how to seek care for testing (follow up with PCP or arrange E-visit).  ***The importance of social distancing was discussed today.  Time:   Today, I have spent *** minutes with the patient with telehealth technology discussing the above problems.     Medication Adjustments/Labs and Tests Ordered: Current medicines are reviewed at length with the patient today.  Concerns regarding medicines are outlined above.   Tests Ordered: No orders of the defined types were placed in this encounter.   Medication Changes: No orders of the defined types were placed in this  encounter.   Follow Up:  {F/U [phone]} {follow up:15908}  Signed, Minus Breeding, MD  12/27/2018 7:54 PM    [facility]

## 2018-12-28 ENCOUNTER — Telehealth: Payer: Self-pay

## 2018-12-28 ENCOUNTER — Telehealth: Payer: Medicare HMO | Admitting: Cardiology

## 2018-12-28 DIAGNOSIS — Z720 Tobacco use: Secondary | ICD-10-CM

## 2018-12-28 DIAGNOSIS — R760 Raised antibody titer: Secondary | ICD-10-CM

## 2018-12-28 DIAGNOSIS — I10 Essential (primary) hypertension: Secondary | ICD-10-CM

## 2018-12-28 NOTE — Telephone Encounter (Signed)
Called patient to prechart before 8:00 Virtual Visit with Dr. Percival Spanish. I was not able to leave a message because mailbox was full.

## 2018-12-28 NOTE — Telephone Encounter (Signed)
Called the patient several times to prechart before 8:00 Virtual Visit with Dr. Percival Spanish. Could not leave a message.

## 2019-01-04 ENCOUNTER — Other Ambulatory Visit: Payer: Self-pay | Admitting: Cardiology

## 2019-01-10 NOTE — Progress Notes (Deleted)
{Choose 1 Note Type (Telehealth Visit or Telephone Visit):[phone]}   Date:  01/10/2019   ID:  Kara Zamora, DOB [birth year], MRN [id number]  {Patient Location:[phone]::"Home"} {Provider [phone]::"Home"}  PCP:  Thana Ates, PA-C  Cardiologist:  Rollene Rotunda, MD *** Electrophysiologist:  None   Evaluation Performed:  {Choose Visit Type:[phone]::"Follow-Up Visit"}  Chief Complaint:  ***  History of Present Illness:    Kara Zamora is a 38 y.o. female with who was referred by Fulp, Nanette N, PA-C for evaluation of antiphospholipid antibody syndrome.  She is had her Coumadin followed in [place] but she is moved to [place].  She has had an antiphospholipid antibody syndrome since 2003.  She had a stroke with resultant blindness.  ***     She does live independently.  She denies any cardiovascular symptoms.  She is had no chest pressure, neck or arm discomfort.  She is had no new palpitations, presyncope or syncope.  She is had no weight gain or edema.  The patient {does/does not:200015} have symptoms concerning for COVID-19 infection (fever, chills, cough, or new shortness of breath).    Past Medical History:  Diagnosis Date  . Anti-phospholipid antibody syndrome (HCC)   . Asthma   . Blind   . Hypertension   . Tobacco abuse 05/30/2014   Past Surgical History:  Procedure Laterality Date  . None       No outpatient medications have been marked as taking for the 01/11/19 encounter (Appointment) with Rollene Rotunda, MD.     Allergies:   Patient has no known allergies.   Social History   Tobacco Use  . Smoking status: Former Smoker    Types: Cigarettes    Quit date: 04/04/2014    Years since quitting: 4.7  . Smokeless tobacco: Never Used  Substance Use Topics  . Alcohol use: Yes    Comment: occ.  . Drug use: No     Family Hx: The patient's family history is not on file.  ROS:   Please see the history of present illness.     *** All other systems reviewed and are negative.   Prior CV studies:   The following studies were reviewed today:  ***  Labs/Other Tests and Data Reviewed:    EKG:  {EKG/Telemetry Strips Reviewed:[phone]}  Recent Labs: No results found for requested labs within last 8760 hours.   Recent Lipid Panel Lab Results  Component Value Date/Time   CHOL 170 05/30/2014 04:30 PM   TRIG 96 05/30/2014 04:30 PM   HDL 52 05/30/2014 04:30 PM   CHOLHDL 3.3 05/30/2014 04:30 PM   LDLCALC 99 05/30/2014 04:30 PM    Wt Readings from Last 3 Encounters:  05/31/14 (!) 368 lb 6.2 oz (167.1 kg)  04/10/14 (!) 370 lb (167.8 kg)     Objective:    Vital Signs:  There were no vitals taken for this visit.   {[facility]Care Virtual Exam (Optional):[phone]::"VITAL SIGNS:  reviewed"}  ASSESSMENT & PLAN:    HTN:  ***  I don't have a blood pressure machine.  She has legal blindness.  We will check this when she comes back to the office.  We might be able to get her audio blood pressure cuff.  ANTIPHOSPHOLIPID ANTIBODY SYNDROME:  *** We will get her established with our Coumadin clinic.  In addition I would like to get a CBC.      TOBACCO ABUSE:   ***  She has cut way back.  I applauded this and encouraged eventual complete abstinence.  COVID-19 Education: The signs and symptoms of COVID-19 were discussed with the patient and how to seek care for testing (follow up with PCP or arrange E-visit).  ***The importance of social distancing was discussed today.  Time:   Today, I have spent *** minutes with the patient with telehealth technology discussing the above problems.     Medication Adjustments/Labs and Tests Ordered: Current medicines are reviewed at length with the patient today.  Concerns regarding medicines are outlined above.   Tests Ordered: No orders of the defined types were placed in this encounter.   Medication Changes: No orders of the defined types were placed in this  encounter.   Follow Up:  {F/U Format:[phone]} {follow up:15908}  Signed, Minus Breeding, MD  01/10/2019 7:38 PM    [facility]

## 2019-01-11 ENCOUNTER — Telehealth: Payer: Medicare HMO | Admitting: Cardiology

## 2019-01-12 ENCOUNTER — Other Ambulatory Visit: Payer: Self-pay

## 2019-01-12 ENCOUNTER — Ambulatory Visit (INDEPENDENT_AMBULATORY_CARE_PROVIDER_SITE_OTHER): Payer: Medicare HMO | Admitting: Pharmacist Clinician (PhC)/ Clinical Pharmacy Specialist

## 2019-01-12 DIAGNOSIS — R760 Raised antibody titer: Secondary | ICD-10-CM | POA: Diagnosis not present

## 2019-01-12 DIAGNOSIS — Z7901 Long term (current) use of anticoagulants: Secondary | ICD-10-CM | POA: Diagnosis not present

## 2019-01-12 LAB — POCT INR: INR: 2 (ref 2.0–3.0)

## 2019-01-24 DIAGNOSIS — B369 Superficial mycosis, unspecified: Secondary | ICD-10-CM | POA: Insufficient documentation

## 2019-02-09 ENCOUNTER — Other Ambulatory Visit: Payer: Self-pay | Admitting: Pharmacist

## 2019-02-09 MED ORDER — WARFARIN SODIUM 4 MG PO TABS: ORAL_TABLET | ORAL | 0 refills | Status: DC

## 2019-02-15 ENCOUNTER — Telehealth: Payer: Self-pay

## 2019-02-15 NOTE — Telephone Encounter (Signed)
lmom for overdue inr 

## 2019-02-21 ENCOUNTER — Other Ambulatory Visit: Payer: Self-pay | Admitting: Cardiology

## 2019-03-05 ENCOUNTER — Other Ambulatory Visit: Payer: Self-pay

## 2019-03-05 MED ORDER — WARFARIN SODIUM 4 MG PO TABS: 4.0000 mg | ORAL_TABLET | Freq: Every day | ORAL | 0 refills | Status: DC

## 2019-03-05 MED ORDER — WARFARIN SODIUM 4 MG PO TABS: ORAL_TABLET | ORAL | 0 refills | Status: DC

## 2019-03-12 ENCOUNTER — Other Ambulatory Visit: Payer: Self-pay

## 2019-03-12 ENCOUNTER — Ambulatory Visit (INDEPENDENT_AMBULATORY_CARE_PROVIDER_SITE_OTHER): Payer: Medicare HMO | Admitting: Pharmacist

## 2019-03-12 DIAGNOSIS — R760 Raised antibody titer: Secondary | ICD-10-CM

## 2019-03-12 DIAGNOSIS — Z7901 Long term (current) use of anticoagulants: Secondary | ICD-10-CM

## 2019-03-12 LAB — POCT INR: INR: 4.2 — AB (ref 2.0–3.0)

## 2019-04-05 ENCOUNTER — Other Ambulatory Visit: Payer: Self-pay | Admitting: Cardiology

## 2019-04-13 ENCOUNTER — Ambulatory Visit (INDEPENDENT_AMBULATORY_CARE_PROVIDER_SITE_OTHER): Payer: Medicare HMO | Admitting: Pharmacist Clinician (PhC)/ Clinical Pharmacy Specialist

## 2019-04-13 ENCOUNTER — Encounter (INDEPENDENT_AMBULATORY_CARE_PROVIDER_SITE_OTHER): Payer: Self-pay

## 2019-04-13 ENCOUNTER — Other Ambulatory Visit: Payer: Self-pay

## 2019-04-13 DIAGNOSIS — Z7901 Long term (current) use of anticoagulants: Secondary | ICD-10-CM | POA: Diagnosis not present

## 2019-04-13 DIAGNOSIS — R760 Raised antibody titer: Secondary | ICD-10-CM | POA: Diagnosis not present

## 2019-04-13 LAB — POCT INR: INR: 2.2 (ref 2.0–3.0)

## 2019-04-27 ENCOUNTER — Other Ambulatory Visit: Payer: Self-pay | Admitting: Cardiology

## 2019-05-24 ENCOUNTER — Telehealth: Payer: Self-pay

## 2019-05-24 NOTE — Telephone Encounter (Signed)
LMOM TO MOVE APPT 

## 2019-06-02 ENCOUNTER — Other Ambulatory Visit: Payer: Self-pay | Admitting: Cardiology

## 2019-06-08 ENCOUNTER — Ambulatory Visit (INDEPENDENT_AMBULATORY_CARE_PROVIDER_SITE_OTHER): Payer: Medicare HMO | Admitting: Pharmacist Clinician (PhC)/ Clinical Pharmacy Specialist

## 2019-06-08 ENCOUNTER — Other Ambulatory Visit: Payer: Self-pay

## 2019-06-08 DIAGNOSIS — R760 Raised antibody titer: Secondary | ICD-10-CM

## 2019-06-08 DIAGNOSIS — Z7901 Long term (current) use of anticoagulants: Secondary | ICD-10-CM | POA: Diagnosis not present

## 2019-06-08 LAB — POCT INR: INR: 2.7 (ref 2.0–3.0)

## 2019-08-03 ENCOUNTER — Other Ambulatory Visit: Payer: Self-pay

## 2019-08-03 ENCOUNTER — Ambulatory Visit (INDEPENDENT_AMBULATORY_CARE_PROVIDER_SITE_OTHER): Payer: Medicare HMO | Admitting: Pharmacist

## 2019-08-03 DIAGNOSIS — R760 Raised antibody titer: Secondary | ICD-10-CM

## 2019-08-03 DIAGNOSIS — Z7901 Long term (current) use of anticoagulants: Secondary | ICD-10-CM | POA: Diagnosis not present

## 2019-08-03 LAB — POCT INR: INR: 2.1 (ref 2.0–3.0)

## 2019-10-26 ENCOUNTER — Ambulatory Visit (INDEPENDENT_AMBULATORY_CARE_PROVIDER_SITE_OTHER): Payer: Medicare HMO | Admitting: Pharmacist

## 2019-10-26 ENCOUNTER — Other Ambulatory Visit: Payer: Self-pay

## 2019-10-26 DIAGNOSIS — Z7901 Long term (current) use of anticoagulants: Secondary | ICD-10-CM

## 2019-10-26 DIAGNOSIS — R760 Raised antibody titer: Secondary | ICD-10-CM

## 2019-10-26 LAB — POCT INR: INR: 2.2 (ref 2.0–3.0)

## 2019-12-03 ENCOUNTER — Other Ambulatory Visit: Payer: Self-pay

## 2019-12-03 MED ORDER — WARFARIN SODIUM 4 MG PO TABS: ORAL_TABLET | ORAL | 1 refills | Status: DC

## 2019-12-13 ENCOUNTER — Telehealth: Payer: Self-pay

## 2019-12-13 NOTE — Telephone Encounter (Signed)
lmom for overdue inr 

## 2019-12-27 ENCOUNTER — Telehealth: Payer: Self-pay

## 2019-12-27 NOTE — Telephone Encounter (Signed)
Called and lmomed the pt for overdue inr 

## 2020-03-31 ENCOUNTER — Ambulatory Visit: Payer: Medicare HMO

## 2020-04-27 ENCOUNTER — Other Ambulatory Visit: Payer: Self-pay | Admitting: Cardiology

## 2020-05-01 ENCOUNTER — Telehealth: Payer: Self-pay

## 2020-05-01 NOTE — Telephone Encounter (Signed)
alled and lmom for overdue inr

## 2020-05-21 ENCOUNTER — Other Ambulatory Visit: Payer: Self-pay

## 2020-05-21 ENCOUNTER — Ambulatory Visit (INDEPENDENT_AMBULATORY_CARE_PROVIDER_SITE_OTHER): Payer: Medicare Other

## 2020-05-21 DIAGNOSIS — Z5181 Encounter for therapeutic drug level monitoring: Secondary | ICD-10-CM

## 2020-05-21 DIAGNOSIS — Z7901 Long term (current) use of anticoagulants: Secondary | ICD-10-CM | POA: Diagnosis not present

## 2020-05-21 DIAGNOSIS — R760 Raised antibody titer: Secondary | ICD-10-CM | POA: Diagnosis not present

## 2020-05-21 LAB — POCT INR: INR: 2.3 (ref 2.0–3.0)

## 2020-05-21 NOTE — Patient Instructions (Signed)
Take 1 tablet Daily except 2 tablets on Monday, Wednesday and Friday.  INR in 6 weeks

## 2020-07-23 ENCOUNTER — Other Ambulatory Visit: Payer: Self-pay | Admitting: Cardiology

## 2020-07-23 ENCOUNTER — Telehealth: Payer: Self-pay

## 2020-07-23 NOTE — Telephone Encounter (Signed)
LMOMED FOR OVERDUE INR 

## 2020-07-23 NOTE — Telephone Encounter (Signed)
lmom for overdue inr 

## 2020-07-25 ENCOUNTER — Telehealth: Payer: Self-pay

## 2020-07-25 NOTE — Telephone Encounter (Signed)
lpmtcb to schedule INR check.  Needs appointment prior to refill.

## 2020-08-04 ENCOUNTER — Other Ambulatory Visit: Payer: Self-pay

## 2020-08-04 ENCOUNTER — Ambulatory Visit (INDEPENDENT_AMBULATORY_CARE_PROVIDER_SITE_OTHER): Payer: Medicare Other

## 2020-08-04 DIAGNOSIS — R760 Raised antibody titer: Secondary | ICD-10-CM | POA: Diagnosis not present

## 2020-08-04 DIAGNOSIS — Z7901 Long term (current) use of anticoagulants: Secondary | ICD-10-CM | POA: Diagnosis not present

## 2020-08-04 LAB — POCT INR: INR: 6.6 — AB (ref 2.0–3.0)

## 2020-08-04 NOTE — Patient Instructions (Signed)
Hold Tuesday, Wednesday and Thursday and then continue 1 tablet Daily except 2 tablets on Monday, Wednesday and Friday.  INR in 1 week

## 2020-08-27 ENCOUNTER — Other Ambulatory Visit: Payer: Self-pay

## 2020-08-27 ENCOUNTER — Ambulatory Visit (INDEPENDENT_AMBULATORY_CARE_PROVIDER_SITE_OTHER): Payer: Medicare Other

## 2020-08-27 DIAGNOSIS — Z7901 Long term (current) use of anticoagulants: Secondary | ICD-10-CM | POA: Diagnosis not present

## 2020-08-27 DIAGNOSIS — R760 Raised antibody titer: Secondary | ICD-10-CM

## 2020-08-27 LAB — POCT INR: INR: 2.3 (ref 2.0–3.0)

## 2020-08-27 NOTE — Patient Instructions (Signed)
continue 1 tablet Daily except 2 tablets on Monday, Wednesday and Friday.  INR in 6 weeks

## 2020-10-09 ENCOUNTER — Telehealth: Payer: Self-pay

## 2020-10-09 NOTE — Telephone Encounter (Signed)
Lmom for overdue inr 

## 2020-10-28 ENCOUNTER — Other Ambulatory Visit: Payer: Self-pay | Admitting: Cardiology

## 2020-10-28 NOTE — Telephone Encounter (Signed)
LMOM FOR OVERDUE INR HOLD UNTIL SEEN

## 2020-11-03 NOTE — Telephone Encounter (Signed)
2nd lmom for overdue inr 

## 2020-11-13 NOTE — Telephone Encounter (Signed)
Called and scheduled an appt for 8/15 at 11am please HOLD REFILL UNTIL SEEN. Aslos schedule md appt while they are here being seen for coumadin as they are overdue for that as well

## 2020-11-17 NOTE — Telephone Encounter (Signed)
Hold till seen on 8//22/22

## 2020-11-24 ENCOUNTER — Ambulatory Visit (INDEPENDENT_AMBULATORY_CARE_PROVIDER_SITE_OTHER): Payer: Medicare Other

## 2020-11-24 ENCOUNTER — Other Ambulatory Visit: Payer: Self-pay

## 2020-11-24 DIAGNOSIS — R760 Raised antibody titer: Secondary | ICD-10-CM | POA: Diagnosis not present

## 2020-11-24 DIAGNOSIS — Z5181 Encounter for therapeutic drug level monitoring: Secondary | ICD-10-CM | POA: Diagnosis not present

## 2020-11-24 DIAGNOSIS — Z7901 Long term (current) use of anticoagulants: Secondary | ICD-10-CM | POA: Diagnosis not present

## 2020-11-24 LAB — POCT INR: INR: 1.4 — AB (ref 2.0–3.0)

## 2020-11-24 NOTE — Patient Instructions (Signed)
Description   Take 2 tablets tomorrow (already taken today's dose) and then continue 1 tablet daily except 2 tablets on Monday, Wednesday and Friday.  INR in 1 week

## 2020-12-02 ENCOUNTER — Telehealth: Payer: Self-pay

## 2020-12-02 NOTE — Telephone Encounter (Signed)
LMOM FOR OVERDUE INR 

## 2020-12-22 ENCOUNTER — Ambulatory Visit: Payer: Medicare Other | Admitting: Medical

## 2020-12-22 ENCOUNTER — Encounter: Payer: Self-pay | Admitting: Medical

## 2020-12-22 ENCOUNTER — Telehealth: Payer: Self-pay

## 2020-12-22 NOTE — Telephone Encounter (Signed)
LMOMED PT TO R/S MISSED APPT

## 2020-12-22 NOTE — Telephone Encounter (Signed)
Called and left a detailed voice message for patient informing her that her appointment for today 12/18/20 at 11:15 AM is being cancelled and that someone will give her a call to reschedule her appointment. I also stated to give our office a call if she is has any questions.

## 2020-12-24 ENCOUNTER — Telehealth: Payer: Self-pay

## 2020-12-24 NOTE — Telephone Encounter (Signed)
Lmom for overdue inr 

## 2020-12-31 ENCOUNTER — Telehealth: Payer: Self-pay

## 2020-12-31 NOTE — Telephone Encounter (Signed)
Lmom for overdue inr 

## 2021-01-07 ENCOUNTER — Other Ambulatory Visit: Payer: Self-pay

## 2021-01-07 ENCOUNTER — Ambulatory Visit (INDEPENDENT_AMBULATORY_CARE_PROVIDER_SITE_OTHER): Payer: Medicare Other

## 2021-01-07 DIAGNOSIS — R760 Raised antibody titer: Secondary | ICD-10-CM

## 2021-01-07 DIAGNOSIS — Z7901 Long term (current) use of anticoagulants: Secondary | ICD-10-CM

## 2021-01-07 DIAGNOSIS — Z5181 Encounter for therapeutic drug level monitoring: Secondary | ICD-10-CM

## 2021-01-07 LAB — POCT INR: INR: 2.4 (ref 2.0–3.0)

## 2021-01-07 NOTE — Patient Instructions (Signed)
continue 1 tablet daily except 2 tablets on Monday, Wednesday and Friday.  INR in 8 weeks

## 2021-01-21 ENCOUNTER — Other Ambulatory Visit: Payer: Self-pay

## 2021-01-21 ENCOUNTER — Ambulatory Visit
Admission: EM | Admit: 2021-01-21 | Discharge: 2021-01-21 | Disposition: A | Discharge status: Home / Self Care | Payer: Medicare Other | Attending: Emergency Medicine | Admitting: Emergency Medicine

## 2021-01-21 ENCOUNTER — Other Ambulatory Visit: Payer: Self-pay | Admitting: Cardiology

## 2021-01-21 DIAGNOSIS — R300 Dysuria: Secondary | ICD-10-CM | POA: Insufficient documentation

## 2021-01-21 DIAGNOSIS — N390 Urinary tract infection, site not specified: Secondary | ICD-10-CM | POA: Insufficient documentation

## 2021-01-21 LAB — POCT URINALYSIS DIP (MANUAL ENTRY)
Bilirubin, UA: NEGATIVE
Glucose, UA: NEGATIVE mg/dL
Leukocytes, UA: NEGATIVE
Nitrite, UA: NEGATIVE
Protein Ur, POC: NEGATIVE mg/dL
Spec Grav, UA: >=1.03 — AB (ref 1.010–1.025)
Urobilinogen, UA: 1 E.U./dL
pH, UA: 5.5 (ref 5.0–8.0)

## 2021-01-21 MED ORDER — FOSFOMYCIN TROMETHAMINE 3 G PO PACK: 3.0000 g | PACK | Freq: Once | ORAL | 0 refills | Status: AC

## 2021-01-21 MED ORDER — SULFAMETHOXAZOLE-TRIMETHOPRIM 800-160 MG PO TABS: 1.0000 | ORAL_TABLET | Freq: Two times a day (BID) | ORAL | 0 refills | Status: DC

## 2021-01-21 NOTE — ED Triage Notes (Signed)
Pt rpeorts possibly having UTI, she reports for the last week she has been having pain to lower abd and urinary urgency.

## 2021-01-21 NOTE — Discharge Instructions (Signed)
Please begin fosfomycin, 3 g packet, please take has been for applesauce.  Once we receive the results of urine culture we will advise you of further treatment if needed.

## 2021-01-22 ENCOUNTER — Telehealth: Payer: Self-pay | Admitting: Emergency Medicine

## 2021-01-22 MED ORDER — SULFAMETHOXAZOLE-TRIMETHOPRIM 800-160 MG PO TABS: 1.0000 | ORAL_TABLET | Freq: Two times a day (BID) | ORAL | 0 refills | Status: DC

## 2021-01-22 NOTE — Telephone Encounter (Signed)
Bactrim reordered to go to pharmacy

## 2021-01-23 ENCOUNTER — Telehealth: Payer: Self-pay

## 2021-01-23 LAB — URINE CULTURE

## 2021-01-26 MED ORDER — SULFAMETHOXAZOLE-TRIMETHOPRIM 800-160 MG PO TABS: 1.0000 | ORAL_TABLET | Freq: Two times a day (BID) | ORAL | 0 refills | Status: AC

## 2021-01-26 NOTE — Telephone Encounter (Signed)
Prescription location changed per patient request. Attempt to call and notify patient, no response. Nurse will try again.

## 2021-02-14 ENCOUNTER — Other Ambulatory Visit: Payer: Self-pay | Admitting: Cardiology

## 2021-02-25 ENCOUNTER — Other Ambulatory Visit: Payer: Self-pay

## 2021-02-25 ENCOUNTER — Ambulatory Visit
Admission: EM | Admit: 2021-02-25 | Discharge: 2021-02-25 | Disposition: A | Discharge status: Home / Self Care | Payer: Medicare Other | Attending: Emergency Medicine | Admitting: Emergency Medicine

## 2021-02-25 DIAGNOSIS — G8929 Other chronic pain: Secondary | ICD-10-CM

## 2021-02-25 DIAGNOSIS — T148XXA Other injury of unspecified body region, initial encounter: Secondary | ICD-10-CM

## 2021-02-25 DIAGNOSIS — M545 Low back pain, unspecified: Secondary | ICD-10-CM | POA: Diagnosis not present

## 2021-02-25 MED ORDER — METHYLPREDNISOLONE 4 MG PO TABS: ORAL_TABLET | ORAL | 0 refills | Status: AC

## 2021-02-25 MED ORDER — KETOROLAC TROMETHAMINE 60 MG/2ML IM SOLN
60.0000 mg | Freq: Once | INTRAMUSCULAR | Status: AC
  Administered 2021-02-25: 60 mg via INTRAMUSCULAR

## 2021-02-25 MED ORDER — METHYLPREDNISOLONE SODIUM SUCC 125 MG IJ SOLR
125.0000 mg | Freq: Once | INTRAMUSCULAR | Status: AC
  Administered 2021-02-25: 125 mg via INTRAMUSCULAR

## 2021-02-25 NOTE — ED Triage Notes (Signed)
Pt reports having pain in lower back and knees (d/t arthritis per patient).  Pt states she had a fall yesterday in her kitchen, she denies hitting her head.

## 2021-02-25 NOTE — ED Provider Notes (Signed)
[facility]    CSN: [id number] Arrival date & time: 02/25/21  0943    No chief complaint on file.  HPI Kara Zamora is a 40 y.o. female. Patient reports chronic lower back pain.  Patient states she usually sees her orthopedic provider every 3 to 4 months for intermittent treatment.  Patient states she receives a ketorolac injection, steroid injection and then a tapering dose of steroids.  Patient is asking if we can provide this for her today.  Patient also states that when she bent over to pick up something yesterday she is lost her footing and her right foot slipped away from her causing her to fall, feels she pulled a muscle on her right inner thigh.  States the muscle feels sore and tight today.  Patient is ambulating independently without assistance.  The history is provided by the patient.  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[place])    Asthma    Blind    Hypertension    Tobacco abuse 05/30/2014   Patient Active Problem List   Diagnosis Date Noted   Essential hypertension 12/27/2018   Medication management 09/01/2018   Antiphospholipid antibody positive 09/01/2018   Blindness of both eyes 09/01/2018   Educated about COVID-19 virus infection 09/01/2018   SOB (shortness of breath)    Asthma exacerbation 05/30/2014   Acute asthma exacerbation 05/30/2014   Antiphospholipid syndrome ([place]) 05/30/2014   Chest pressure 05/30/2014   Tobacco abuse 05/30/2014   Sinusitis, acute 05/30/2014   Past Surgical History:  Procedure Laterality Date   None     OB History   No obstetric history on file.    Home Medications    Prior to Admission medications   Medication Sig Start Date End Date Taking? Authorizing Provider  methylPREDNISolone (MEDROL) 4 MG tablet Take 10 tablets (40 mg total) by mouth daily for 1 day, THEN 9 tablets (36 mg total) daily for 1 day, THEN 8 tablets (32 mg total) daily for 1 day, THEN 7 tablets (28 mg  total) daily for 1 day, THEN 6 tablets (24 mg total) daily for 1 day, THEN 5 tablets (20 mg total) daily for 1 day, THEN 4 tablets (16 mg total) daily for 1 day, THEN 3 tablets (12 mg total) daily for 1 day, THEN 2 tablets (8 mg total) daily for 1 day, THEN 1 tablet (4 mg total) daily for 1 day. 02/25/21 03/07/21 Yes Lynden Oxford Scales, PA-C  albuterol (PROVENTIL HFA;VENTOLIN HFA) 108 (90 BASE) MCG/ACT inhaler Inhale 1-2 puffs into the lungs every 6 (six) hours as needed for wheezing or shortness of breath. Patient taking differently: Inhale 2 puffs into the lungs 4 (four) times daily.  06/01/14   Mikhail, Velta Addison, DO  buPROPion (WELLBUTRIN SR) 150 MG 12 hr tablet Take 1 tablet by mouth 2 (two) times daily. 07/20/18   [provider]  lisinopril (ZESTRIL) 10 MG tablet Take 1 tablet (10 mg total) by mouth daily. 12/05/18   Minus Breeding, MD  norethindrone-ethinyl estradiol 1/35 (ORTHO-NOVUM, NORTREL,CYCLAFEM) tablet Take 1 tablet by mouth daily.    [provider]  traZODone (DESYREL) 50 MG tablet Take 50 mg by mouth at bedtime. 08/18/18   [provider]  valACYclovir (VALTREX) 500 MG tablet Take 500 mg by mouth daily.    [provider]  warfarin (COUMADIN) 4 MG tablet TAKE 1 TO 2 TABLETS BY MOUTH DAILY AS DIRECTED BY COUMADIN CLINIC 02/16/21   Minus Breeding, MD  Family History History reviewed. No pertinent family history. Social History Social History   Tobacco Use   Smoking status: Every Day    Types: Cigarettes    Last attempt to quit: 04/04/2014    Years since quitting: 6.9   Smokeless tobacco: Never  Vaping Use   Vaping Use: Never used  Substance Use Topics   Alcohol use: Yes    Comment: occ.   Drug use: No   Allergies   Patient has no known allergies.  Review of Systems Review of Systems Pertinent findings noted in history of present illness.   Physical Exam Triage Vital Signs ED Triage Vitals  Enc Vitals Group     BP 01/30/21 0827  (!) 147/82     Pulse Rate 01/30/21 0827 72     Resp 01/30/21 0827 18     Temp 01/30/21 0827 98.3 F (36.8 C)     Temp Source 01/30/21 0827 Oral     SpO2 01/30/21 0827 98 %     Weight --      Height --      Head Circumference --      Peak Flow --      Pain Score 01/30/21 0826 5     Pain Loc --      Pain Edu? --      Excl. in [place]? --   No data found.  Updated Vital Signs BP (!) 154/93 (BP Location: Right Arm)   Pulse 92   Temp 98 F (36.7 C) (Oral)   Resp 20   LMP 02/02/2021   SpO2 97%   Physical Exam Vitals and nursing note reviewed.  Constitutional:      General: She is not in acute distress.    Appearance: Normal appearance. She is not ill-appearing.     Comments: Patient is morbidly obese  HENT:     Head: Normocephalic and atraumatic.  Eyes:     General: Lids are normal.        Right eye: No discharge.        Left eye: No discharge.     Extraocular Movements: Extraocular movements intact.     Conjunctiva/sclera: Conjunctivae normal.     Right eye: Right conjunctiva is not injected.     Left eye: Left conjunctiva is not injected.  Neck:     Trachea: Trachea and phonation normal.  Cardiovascular:     Rate and Rhythm: Normal rate and regular rhythm.     Pulses: Normal pulses.     Heart sounds: Normal heart sounds. No murmur heard.   No friction rub. No gallop.  Pulmonary:     Effort: Pulmonary effort is normal. No accessory muscle usage, prolonged expiration or respiratory distress.     Breath sounds: Normal breath sounds. No stridor, decreased air movement or transmitted upper airway sounds. No decreased breath sounds, wheezing, rhonchi or rales.  Chest:     Chest wall: No tenderness.  Musculoskeletal:        General: Tenderness present. No swelling, deformity or signs of injury. Normal range of motion.     Cervical back: Normal range of motion and neck supple. Normal range of motion.     Right lower leg: Edema present.     Left lower leg: Edema present.   Lymphadenopathy:     Cervical: No cervical adenopathy.  Skin:    General: Skin is warm and dry.     Findings: No erythema or rash.  Neurological:     General: No  focal deficit present.     Mental Status: She is alert and oriented to person, place, and time.  Psychiatric:        Mood and Affect: Mood normal.        Behavior: Behavior normal.    Visual Acuity Right Eye Distance:   Left Eye Distance:   Bilateral Distance:    Right Eye Near:   Left Eye Near:    Bilateral Near:     UC Couse / Diagnostics / Procedures:    EKG  Radiology No results found.  Procedures Procedures (including critical care time)  UC Diagnoses / Final Clinical Impressions(s)    Final diagnoses:  Chronic bilateral low back pain, unspecified whether sciatica present  Muscle strain   I have reviewed the triage vital signs and the nursing notes.  Pertinent labs & imaging results that were available during my care of the patient were reviewed by me and considered in my medical decision making (see chart for details).    Chronic lower back pain.  Treatment provided as patient requested, EMR reviewed.  Patient counseled regarding the risks steroid use with elevated blood pressure, encouraged to take all her blood pressure medications exactly as prescribed and not skip any doses.  Patient/parent/caregiver verbalized understanding and agreement of plan as discussed.  All questions were addressed during visit.  Please see discharge instructions below for further details of plan.  ED Prescriptions     Medication Sig Dispense Auth. Provider   methylPREDNISolone (MEDROL) 4 MG tablet Take 10 tablets (40 mg total) by mouth daily for 1 day, THEN 9 tablets (36 mg total) daily for 1 day, THEN 8 tablets (32 mg total) daily for 1 day, THEN 7 tablets (28 mg total) daily for 1 day, THEN 6 tablets (24 mg total) daily for 1 day, THEN 5 tablets (20 mg total) daily for 1 day, THEN 4 tablets (16 mg total) daily for 1  day, THEN 3 tablets (12 mg total) daily for 1 day, THEN 2 tablets (8 mg total) daily for 1 day, THEN 1 tablet (4 mg total) daily for 1 day. 55 tablet Lynden Oxford Scales, PA-C      PDMP not reviewed this encounter.  Pending results:  Labs Reviewed - No data to display   Medications Ordered in UC: Medications  ketorolac (TORADOL) injection 60 mg (has no administration in time range)  methylPREDNISolone sodium succinate (SOLU-MEDROL) 125 mg/2 mL injection 125 mg (has no administration in time range)    Discharge Instructions:   Discharge Instructions      For your pain in your back and your right inner thigh, you were provided with 2 injections today, ketorolac and is strong steroid called Solu-Medrol.  Please follow this up with an oral tapering dose of methylprednisolone tablets.  Please take exactly as prescribed.  If your insurance coverage is not as good she would like it, this prescription will cost to $27 with a good Rx coupon, I have printed 1 for you and it is at the back of this packet.       HISTORY   Disposition Upon Discharge:  Patient presented with an acute illness with associated systemic symptoms and significant discomfort requiring urgent management. In my opinion, this is a condition that a prudent lay person (someone who possesses an average knowledge of health and medicine) may potentially expect to result in complications if not addressed urgently such as respiratory distress, impairment of bodily function or dysfunction of bodily organs.  Routine symptom specific, illness specific and/or disease specific instructions were discussed with the patient and/or caregiver at length.   As such, the patient has been evaluated and assessed, work-up was performed and treatment was provided in alignment with urgent care protocols and evidence based medicine.  Patient/parent/caregiver has been advised that the patient may require follow up for further testing and  treatment if the symptoms continue in spite of treatment, as clinically indicated and appropriate.  If the patient was tested for COVID-19, Influenza and/or RSV, then the patient/parent/guardian was advised to isolate at home pending the results of his/her diagnostic coronavirus test and potentially longer if they're positive. I have also advised pt that if his/her COVID-19 test returns positive, it's recommended to self-isolate for at least 10 days after symptoms first appeared AND until fever-free for 24 hours without fever reducer AND other symptoms have improved or resolved. Discussed self-isolation recommendations as well as instructions for household member/close contacts as per the [facility] and [place] DHHS, and also gave patient the COVID packet with this information.  Patient/parent/caregiver has been advised to return to the [facility] or PCP in 3-5 days if no better; to PCP or the Emergency Department if new signs and symptoms develop, or if the current signs or symptoms continue to change or worsen for further workup, evaluation and treatment as clinically indicated and appropriate  The patient will follow up with their current PCP if and as advised. If the patient does not currently have a PCP we will assist them in obtaining one.   The patient may need specialty follow up if the symptoms continue, in spite of conservative treatment and management, for further workup, evaluation, consultation and treatment as clinically indicated and appropriate.  Condition: stable for discharge home Home: take medications as prescribed; routine discharge instructions as discussed; follow up as advised.    Theadora Rama Scales, PA-C 02/25/21 1151

## 2021-02-25 NOTE — Discharge Instructions (Addendum)
For your pain in your back and your right inner thigh, you were provided with 2 injections today, ketorolac and is strong steroid called Solu-Medrol.  Please follow this up with an oral tapering dose of methylprednisolone tablets.  Please take exactly as prescribed.  If your insurance coverage is not as good she would like it, this prescription will cost to $27 with a good Rx coupon, I have printed 1 for you and it is at the back of this packet.

## 2021-02-27 NOTE — Progress Notes (Signed)
Cardiology Office Note:    Date:  03/03/2021   ID:  Kara Zamora, DOB Aug 27, 1980, MRN [id number]  PCP:  Thana Ates, PA-C  Cardiologist:  Rollene Rotunda, MD  Electrophysiologist:  None   Referring MD: Thana Ates, PA-C   Chief Complaint: follow-up of antiphospholipid antibody syndrome and hypertension  History of Present Illness:    Kara Zamora is a 40 y.o. 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 Antoine Poche and presents today for routine follow-up of antiphospholipid antibody syndrome and hypertension.  Patient was referred to Dr. Antoine Poche in 08/2018 to get established in our Coumadin clinic for her antiphospholipid antibody syndrome. Prior Echo in 2016 showed LVEF of 65-70% with mild LVH and mild MR. She denied any cardiac symptoms at that initial visit with Dr. Antoine Poche. She has followed in our Coumadin Clinic since that time but has not been seen by Dr. Antoine Poche or an APP since.  Patient presents today for follow-up. Here alone. Patient reports doing well since last visit. She denies any chest pain or shortness of breath. She notes occasional difficult breathing at night which she attributes to her asthma and improves with her inhaler. Does not sound like true orthopnea or PND. She does have sleep apnea and uses a CPAP machine. She denies any significant lower extremity edema. No palpitations, lightheadedness, or dizziness.  Her BP is elevated in the office today at 160/80. She has not taken her BP medication this morning but states systolic BP usually in the 140s at home. She continues to smoke and reports smoking 2-3 cigarettes/day. It sounds like it is mostly comfort thing at this point as she normally does not finish these cigarettes and she just likes having it in her hand especially if she is more  stressed/anxious.  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HCC)    Asthma    Blind    Hypertension    Stroke [facility])    Tobacco abuse 05/30/2014    Past Surgical History:  Procedure Laterality Date   None      Current Medications: Current Meds  Medication Sig   albuterol (PROVENTIL HFA;VENTOLIN HFA) 108 (90 BASE) MCG/ACT inhaler Inhale 1-2 puffs into the lungs every 6 (six) hours as needed for wheezing or shortness of breath. (Patient taking differently: Inhale 2 puffs into the lungs 4 (four) times daily.)   buPROPion (WELLBUTRIN SR) 150 MG 12 hr tablet Take 1 tablet by mouth 2 (two) times daily.   methylPREDNISolone (MEDROL) 4 MG tablet Take 10 tablets (40 mg total) by mouth daily for 1 day, THEN 9 tablets (36 mg total) daily for 1 day, THEN 8 tablets (32 mg total) daily for 1 day, THEN 7 tablets (28 mg total) daily for 1 day, THEN 6 tablets (24 mg total) daily for 1 day, THEN 5 tablets (20 mg total) daily for 1 day, THEN 4 tablets (16 mg total) daily for 1 day, THEN 3 tablets (12 mg total) daily for 1 day, THEN 2 tablets (8 mg total) daily for 1 day, THEN 1 tablet (4 mg total) daily for 1 day.   norethindrone-ethinyl estradiol 1/35 (ORTHO-NOVUM, NORTREL,CYCLAFEM) tablet Take 1 tablet by mouth daily.   traZODone (DESYREL) 50 MG tablet Take 50 mg by mouth at bedtime.   valACYclovir (VALTREX) 500 MG tablet Take 500 mg by  mouth daily.   warfarin (COUMADIN) 4 MG tablet TAKE 1 TO 2 TABLETS BY MOUTH DAILY AS DIRECTED BY COUMADIN CLINIC   [DISCONTINUED] lisinopril (ZESTRIL) 10 MG tablet Take 1 tablet (10 mg total) by mouth daily.     Allergies:   Patient has no known allergies.   Social History   Socioeconomic History   Marital status: Married    Spouse name: Not on file   Number of children: Not on file   Years of education: Not on file   Highest education level: Not on file  Occupational History   Not on file  Tobacco Use   Smoking status:  Every Day    Types: Cigarettes    Last attempt to quit: 04/04/2014    Years since quitting: 6.9   Smokeless tobacco: Never  Vaping Use   Vaping Use: Never used  Substance and Sexual Activity   Alcohol use: Yes    Comment: occ.   Drug use: No   Sexual activity: Not on file  Other Topics Concern   Not on file  Social History Narrative   Lives alone.  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   Social Determinants of Health   Financial Resource Strain: Not on file  Food Insecurity: Not on file  Transportation Needs: Not on file  Physical Activity: Not on file  Stress: Not on file  Social Connections: Not on file     Family History: The patient's family history is not on file.  ROS:   Please see the history of present illness.     EKGs/Labs/Other Studies Reviewed:    The following studies were reviewed today:  Echocardiogram 06/01/2014: Study Conclusions: - Left ventricle: The cavity size was normal. Wall thickness was    increased in a pattern of mild LVH. Systolic function was    vigorous. The estimated ejection fraction was in the range of 65%    to 70%. Left ventricular diastolic function parameters were    normal.  - Mitral valve: There was mild regurgitation.   EKG:  EKG ordered today. EKG personally reviewed and demonstrates normal sinus rhythm with sinus arrhythmia, rate 80 pm, with no acute ST/T changes. Normal axis. Normal PR and QRS intervals. QTc 401 ms.  Recent Labs: No results found for requested labs within last 8760 hours.  Recent Lipid Panel    Component Value Date/Time   CHOL 170 05/30/2014 1630   TRIG 96 05/30/2014 1630   HDL 52 05/30/2014 1630   CHOLHDL 3.3 05/30/2014 1630   VLDL 19 05/30/2014 1630   LDLCALC 99 05/30/2014 1630    Physical Exam:    Vital Signs: BP (!) 160/80 (BP Location: Left Arm, Patient Position: Sitting, Cuff Size: Normal)   Pulse 80   Resp 20   Ht 5\' 2"  (1.575 m)   Wt (!) 411 lb (186.4 kg)   LMP 02/02/2021   SpO2 97%   BMI  75.17 kg/m     Wt Readings from Last 3 Encounters:  03/03/21 (!) 411 lb (186.4 kg)  01/21/21 (!) 398 lb (180.5 kg)  05/31/14 (!) 368 lb 6.2 oz (167.1 kg)     General: 40 y.o. morbidly obese African-American female in no acute distress. HEENT: Normocephalic and atraumatic. Sclera clear.  Neck: Supple. Unable to assess JVD due to body habitus. Heart: RRR. Distinct S1 and S2. No murmurs, gallops, or rubs. Radial pulses 2+ and equal bilaterally. Lungs: No increased work of breathing. Scattered rhonchi/faint wheezing noted that would improve but not completely  clear with coughing. No crackles.  Abdomen: Soft, non-distended, and non-tender to palpation.  Extremities: Minimal lower extremity edema bilaterally. Skin: Warm and dry. Neuro: Alert and oriented x3. No focal deficits. Psych: Normal affect. Responds appropriately.  Assessment:    1. Antiphospholipid antibody syndrome (HCC)   2. History of CVA   3. Primary hypertension   4. Tobacco abuse   5. Medication management   6. Nonrheumatic mitral valve regurgitation     Plan:    Antiphospholipid Antibody Syndrome Diagnosed with antiphospholipid antibody syndrome after a stroke in 2003. She has had residual blindness from this and has been on chronic anticoagulation with Coumadin. - No new CVA or thrombotic event. - Continue Coumadin. Followed in our Coumadin Clinic.   Mild Mitral Regurgitation Noted on Echo in 2016.  - Will repeat Echo to reassess MR.  Hypertension BP elevated at 160/80 today; however, she has not taken her medications today. Systolic BP normally in the 140s at home. - Will increase Lisinopril to 20mg  daily. - Will repeat BMET today and again in 1-2 weeks after increasing Lisinopril. - Advised patient to let know if BP stays consistently above 130/80 after increasing Lisinopril.  Tobacco Abuse She continues to smoke 3-4 cigarettes today. She is already on Wellbutrin. - Encouraged patient to continue to  work towards complete cessation.  Obstructive Sleep Apnea On CPAP. - Continue.  Morbid Obesity BMI 75. - Weight loss will be very important. Discussed importance of increasing physical activity and following a heart healthy diet. Goal is at least 150 minutes of physical activity per week.  Of note, patient expressed desire for cardiac screening to assess risks. She is not having any cardiac symptoms but just wanted to be screened. Her maternal grandmother died from cardiomyopathy so she is concerned. Will get routine Echo for monitoring or MR as stated above. Also offered coronary calcium score but patient wanted to think about it more. Patient does have a PCP and states she has routine labs, including lipid panel, checked annually.  Disposition: Follow up in 1 year.   Medication Adjustments/Labs and Tests Ordered: Current medicines are reviewed at length with the patient today.  Concerns regarding medicines are outlined above.  Orders Placed This Encounter  Procedures   Basic metabolic panel   Basic metabolic panel   EKG 12-Lead   ECHOCARDIOGRAM COMPLETE   Meds ordered this encounter  Medications   lisinopril (ZESTRIL) 20 MG tablet    Sig: Take 1 tablet (20 mg total) by mouth daily.    Dispense:  90 tablet    Refill:  3    Patient Instructions  Medication Instructions:  INCREASE Lisinopril to 20 mg daily  *If you need a refill on your cardiac medications before your next appointment, please call your pharmacy*   Lab Work: Your physician recommends that you return for lab work TODAY or TOMORROW:  BMET  Your physician recommends that you return for lab work in 1 week:  BMET If you have labs (blood work) drawn today and your tests are completely normal, you will receive your results only by: [place] (if you have MyChart) OR A paper copy in the mail If you have any lab test that is abnormal or we need to change your treatment, we will call you to review the  results.  Testing/Procedures: Your physician has requested that you have an echocardiogram. Echocardiography is a painless test that uses sound waves to create images of your heart. It provides your doctor with  information about the size and shape of your heart and how well your heart's chambers and valves are working. This procedure takes approximately one hour. There are no restrictions for this procedure.  Please schedule for 3 months   Follow-Up: At [facility], you and your health needs are our priority.  As part of our continuing mission to provide you with exceptional heart care, we have created designated Provider Care Teams.  These Care Teams include your primary Cardiologist (physician) and Advanced Practice Providers (APPs -  Physician Assistants and Nurse Practitioners) who all work together to provide you with the care you need, when you need it.  We recommend signing up for the patient portal called "MyChart".  Sign up information is provided on this After Visit Summary.  MyChart is used to connect with patients for Virtual Visits (Telemedicine).  Patients are able to view lab/test results, encounter notes, upcoming appointments, etc.  Non-urgent messages can be sent to your provider as well.   To learn more about what you can do with MyChart, go to [email or web address].    Your next appointment:   1 year(s)  The format for your next appointment:   In Person  Provider:   Rollene Rotunda, MD    Other Instructions Continue to monitor blood pressure at home. If your blood pressure is persistently greater than 130/80 please give our office a call   Signed, Corrin Parker, PA-C  03/03/2021 12:54 PM    [facility]

## 2021-02-28 ENCOUNTER — Encounter: Payer: Self-pay | Admitting: Student

## 2021-02-28 DIAGNOSIS — I639 Cerebral infarction, unspecified: Secondary | ICD-10-CM | POA: Insufficient documentation

## 2021-03-03 ENCOUNTER — Ambulatory Visit (INDEPENDENT_AMBULATORY_CARE_PROVIDER_SITE_OTHER): Payer: Medicare Other | Admitting: Student

## 2021-03-03 ENCOUNTER — Ambulatory Visit (INDEPENDENT_AMBULATORY_CARE_PROVIDER_SITE_OTHER): Payer: Medicare Other | Admitting: Pharmacist

## 2021-03-03 ENCOUNTER — Other Ambulatory Visit: Payer: Self-pay

## 2021-03-03 ENCOUNTER — Encounter: Payer: Self-pay | Admitting: Student

## 2021-03-03 VITALS — BP 160/80 | HR 80 | Resp 20 | Ht 62.0 in | Wt >= 6400 oz

## 2021-03-03 DIAGNOSIS — G4733 Obstructive sleep apnea (adult) (pediatric): Secondary | ICD-10-CM

## 2021-03-03 DIAGNOSIS — I639 Cerebral infarction, unspecified: Secondary | ICD-10-CM | POA: Diagnosis not present

## 2021-03-03 DIAGNOSIS — D6861 Antiphospholipid syndrome: Secondary | ICD-10-CM

## 2021-03-03 DIAGNOSIS — R760 Raised antibody titer: Secondary | ICD-10-CM | POA: Diagnosis not present

## 2021-03-03 DIAGNOSIS — I10 Essential (primary) hypertension: Secondary | ICD-10-CM | POA: Diagnosis not present

## 2021-03-03 DIAGNOSIS — Z720 Tobacco use: Secondary | ICD-10-CM

## 2021-03-03 DIAGNOSIS — Z79899 Other long term (current) drug therapy: Secondary | ICD-10-CM

## 2021-03-03 DIAGNOSIS — I340 Nonrheumatic mitral (valve) insufficiency: Secondary | ICD-10-CM | POA: Diagnosis not present

## 2021-03-03 DIAGNOSIS — Z7901 Long term (current) use of anticoagulants: Secondary | ICD-10-CM

## 2021-03-03 LAB — POCT INR: INR: 1.9 — AB (ref 2.0–3.0)

## 2021-03-03 MED ORDER — LISINOPRIL 20 MG PO TABS: 20.0000 mg | ORAL_TABLET | Freq: Every day | ORAL | 3 refills | Status: DC

## 2021-03-03 NOTE — Patient Instructions (Signed)
Medication Instructions:  INCREASE Lisinopril to 20 mg daily  *If you need a refill on your cardiac medications before your next appointment, please call your pharmacy*   Lab Work: Your physician recommends that you return for lab work TODAY or TOMORROW:  BMET  Your physician recommends that you return for lab work in 1 week:  BMET If you have labs (blood work) drawn today and your tests are completely normal, you will receive your results only by: [employer] (if you have MyChart) OR A paper copy in the mail If you have any lab test that is abnormal or we need to change your treatment, we will call you to review the results.  Testing/Procedures: Your physician has requested that you have an echocardiogram. Echocardiography is a painless test that uses sound waves to create images of your heart. It provides your doctor with information about the size and shape of your heart and how well your heart's chambers and valves are working. This procedure takes approximately one hour. There are no restrictions for this procedure.  Please schedule for 3 months   Follow-Up: At [facility], you and your health needs are our priority.  As part of our continuing mission to provide you with exceptional heart care, we have created designated Provider Care Teams.  These Care Teams include your primary Cardiologist (physician) and Advanced Practice Providers (APPs -  Physician Assistants and Nurse Practitioners) who all work together to provide you with the care you need, when you need it.  We recommend signing up for the patient portal called "MyChart".  Sign up information is provided on this After Visit Summary.  MyChart is used to connect with patients for Virtual Visits (Telemedicine).  Patients are able to view lab/test results, encounter notes, upcoming appointments, etc.  Non-urgent messages can be sent to your provider as well.   To learn more about what you can do with MyChart, go to  [email or web address].    Your next appointment:   1 year(s)  The format for your next appointment:   In Person  Provider:   Rollene Rotunda, MD    Other Instructions Continue to monitor blood pressure at home. If your blood pressure is persistently greater than 130/80 please give our office a call

## 2021-03-03 NOTE — Patient Instructions (Signed)
Description   Continue 1 tablet daily except 2 tablets on Monday, Wednesday and Friday.  INR in 8 weeks

## 2021-03-04 ENCOUNTER — Other Ambulatory Visit: Payer: Self-pay

## 2021-03-04 LAB — BASIC METABOLIC PANEL
BUN/Creatinine Ratio: 14 (ref 9–23)
BUN: 13 mg/dL (ref 6–24)
CO2: 22 mmol/L (ref 20–29)
Calcium: 10.1 mg/dL (ref 8.7–10.2)
Chloride: 102 mmol/L (ref 96–106)
Creatinine, Ser: 0.94 mg/dL (ref 0.57–1.00)
Glucose: 80 mg/dL (ref 70–99)
Potassium: 4.8 mmol/L (ref 3.5–5.2)
Sodium: 138 mmol/L (ref 134–144)
eGFR: 79 mL/min/{1.73_m2} (ref >59)

## 2021-03-13 ENCOUNTER — Other Ambulatory Visit: Payer: Self-pay | Admitting: Cardiology

## 2021-04-22 ENCOUNTER — Telehealth: Payer: Self-pay

## 2021-04-22 NOTE — Telephone Encounter (Signed)
Lmom to r/s missed inr

## 2021-04-23 ENCOUNTER — Other Ambulatory Visit: Payer: Self-pay | Admitting: Cardiology

## 2021-04-27 ENCOUNTER — Telehealth: Payer: Self-pay

## 2021-04-27 NOTE — Telephone Encounter (Signed)
Lpm to schedule INR check 

## 2021-05-04 ENCOUNTER — Telehealth: Payer: Self-pay

## 2021-05-04 NOTE — Telephone Encounter (Signed)
Tried calling to schedule INR check, mailbox full

## 2021-05-08 ENCOUNTER — Telehealth: Payer: Self-pay

## 2021-05-08 NOTE — Telephone Encounter (Signed)
Lpmtcb and schedule INR check 

## 2021-05-15 ENCOUNTER — Telehealth: Payer: Self-pay

## 2021-05-15 NOTE — Telephone Encounter (Signed)
Lpmtcb and schedule INR check 

## 2021-05-28 ENCOUNTER — Other Ambulatory Visit: Payer: Self-pay

## 2021-05-28 ENCOUNTER — Ambulatory Visit (HOSPITAL_COMMUNITY): Payer: Medicare Other | Attending: Cardiology

## 2021-05-28 DIAGNOSIS — I340 Nonrheumatic mitral (valve) insufficiency: Secondary | ICD-10-CM | POA: Insufficient documentation

## 2021-05-28 LAB — ECHOCARDIOGRAM COMPLETE
Area-P 1/2: 4.49 cm2
S' Lateral: 3.55 cm

## 2021-05-29 ENCOUNTER — Telehealth: Payer: Self-pay

## 2021-05-29 NOTE — Telephone Encounter (Signed)
Lpmtcb and schedule INR check 

## 2021-06-04 ENCOUNTER — Telehealth: Payer: Self-pay | Admitting: Student

## 2021-06-04 NOTE — Telephone Encounter (Signed)
Returned call to pt. Pt informed of providers result & recommendations. Pt verbalized understanding. No further questions . ?FYI: Pt is blind ?

## 2021-06-04 NOTE — Telephone Encounter (Signed)
Patient returned call for her echo results.  She said it's okay to leave a detailed message on her VM.  ?

## 2021-06-05 ENCOUNTER — Telehealth: Payer: Self-pay

## 2021-06-05 NOTE — Telephone Encounter (Signed)
Lpmtcb and schedule INR check 

## 2021-06-10 ENCOUNTER — Other Ambulatory Visit: Payer: Self-pay

## 2021-06-10 MED ORDER — WARFARIN SODIUM 4 MG PO TABS: ORAL_TABLET | ORAL | 0 refills | Status: DC

## 2021-06-24 ENCOUNTER — Ambulatory Visit (INDEPENDENT_AMBULATORY_CARE_PROVIDER_SITE_OTHER): Payer: Medicare Other

## 2021-06-24 ENCOUNTER — Other Ambulatory Visit: Payer: Self-pay

## 2021-06-24 DIAGNOSIS — Z7901 Long term (current) use of anticoagulants: Secondary | ICD-10-CM | POA: Diagnosis not present

## 2021-06-24 DIAGNOSIS — R760 Raised antibody titer: Secondary | ICD-10-CM

## 2021-06-24 LAB — POCT INR: INR: 1.7 — AB (ref 2.0–3.0)

## 2021-06-24 NOTE — Patient Instructions (Signed)
TAKE 3 TABLETS TODAY ONLY and then Increase to 2 tablets daily except 1 tablet on Monday, Wednesday and Friday.  INR in 2 weeks ?

## 2021-07-09 ENCOUNTER — Telehealth: Payer: Self-pay

## 2021-07-09 NOTE — Telephone Encounter (Signed)
Lmom for missed appt  

## 2021-07-13 ENCOUNTER — Telehealth: Payer: Self-pay

## 2021-07-13 NOTE — Telephone Encounter (Signed)
Mailbox full.  I tried to reschedule patient for INR check. ?

## 2021-07-17 ENCOUNTER — Encounter: Payer: Self-pay | Admitting: Pharmacist

## 2021-07-17 ENCOUNTER — Other Ambulatory Visit: Payer: Self-pay | Admitting: Pharmacist

## 2021-07-17 DIAGNOSIS — R760 Raised antibody titer: Secondary | ICD-10-CM

## 2021-07-17 DIAGNOSIS — D6861 Antiphospholipid syndrome: Secondary | ICD-10-CM

## 2021-07-17 DIAGNOSIS — Z7901 Long term (current) use of anticoagulants: Secondary | ICD-10-CM

## 2021-07-17 MED ORDER — WARFARIN SODIUM 4 MG PO TABS: ORAL_TABLET | ORAL | 0 refills | Status: DC

## 2021-07-17 NOTE — Telephone Encounter (Signed)
This encounter was created in error - please disregard.

## 2021-07-30 ENCOUNTER — Ambulatory Visit (INDEPENDENT_AMBULATORY_CARE_PROVIDER_SITE_OTHER): Payer: Medicare Other

## 2021-07-30 DIAGNOSIS — R760 Raised antibody titer: Secondary | ICD-10-CM | POA: Diagnosis not present

## 2021-07-30 DIAGNOSIS — Z7901 Long term (current) use of anticoagulants: Secondary | ICD-10-CM | POA: Diagnosis not present

## 2021-07-30 LAB — POCT INR: INR: 3.1 — AB (ref 2.0–3.0)

## 2021-07-30 NOTE — Patient Instructions (Signed)
Continue 2 tablets daily except 1 tablet on Monday, Wednesday and Friday.  INR in 6 weeks ?

## 2021-08-05 ENCOUNTER — Telehealth: Payer: Self-pay | Admitting: Cardiology

## 2021-08-05 ENCOUNTER — Other Ambulatory Visit: Payer: Self-pay

## 2021-08-05 DIAGNOSIS — Z7901 Long term (current) use of anticoagulants: Secondary | ICD-10-CM

## 2021-08-05 DIAGNOSIS — D6861 Antiphospholipid syndrome: Secondary | ICD-10-CM

## 2021-08-05 DIAGNOSIS — R760 Raised antibody titer: Secondary | ICD-10-CM

## 2021-08-05 MED ORDER — WARFARIN SODIUM 4 MG PO TABS: ORAL_TABLET | ORAL | 1 refills | Status: DC

## 2021-08-05 MED ORDER — WARFARIN SODIUM 4 MG PO TABS: ORAL_TABLET | ORAL | 0 refills | Status: DC

## 2021-08-05 NOTE — Telephone Encounter (Signed)
?*  STAT* If patient is at the pharmacy, call can be transferred to refill team. ? ? ?1. Which medications need to be refilled? (please list name of each medication and dose if known) warfarin (COUMADIN) 4 MG tablet ? ?2. Which pharmacy/location (including street and city if local pharmacy) is medication to be sent to? [id number] - [place], [place] - [address] ? ?3. Do they need a 30 day or 90 day supply? 90 day supply ? ? ? Completely out of medication. ?

## 2021-08-07 ENCOUNTER — Other Ambulatory Visit: Payer: Self-pay | Admitting: Student

## 2021-08-10 NOTE — Telephone Encounter (Signed)
Lisinopril was increased to 20mg  once daily at last visit. We can refill this but patient was supposed to come in for a repeat BMET to make sure kidney function and electrolytes were stable. Can we ask her to come in for this (unless she had labs checked elsewhere) Thank you! ?

## 2021-08-14 ENCOUNTER — Other Ambulatory Visit: Payer: Self-pay | Admitting: Student

## 2021-08-14 DIAGNOSIS — R760 Raised antibody titer: Secondary | ICD-10-CM

## 2021-08-14 DIAGNOSIS — D6861 Antiphospholipid syndrome: Secondary | ICD-10-CM

## 2021-08-14 DIAGNOSIS — Z7901 Long term (current) use of anticoagulants: Secondary | ICD-10-CM

## 2021-09-07 ENCOUNTER — Ambulatory Visit
Admission: EM | Admit: 2021-09-07 | Discharge: 2021-09-07 | Disposition: A | Discharge status: Home / Self Care | Payer: Medicare Other | Attending: Nurse Practitioner | Admitting: Nurse Practitioner

## 2021-09-07 ENCOUNTER — Encounter: Payer: Self-pay | Admitting: Emergency Medicine

## 2021-09-07 DIAGNOSIS — I10 Essential (primary) hypertension: Secondary | ICD-10-CM | POA: Diagnosis not present

## 2021-09-07 DIAGNOSIS — L304 Erythema intertrigo: Secondary | ICD-10-CM

## 2021-09-07 DIAGNOSIS — R519 Headache, unspecified: Secondary | ICD-10-CM

## 2021-09-07 MED ORDER — HYDROCHLOROTHIAZIDE 25 MG PO TABS: 25.0000 mg | ORAL_TABLET | Freq: Every day | ORAL | 0 refills | Status: DC

## 2021-09-07 MED ORDER — NYSTATIN 100000 UNIT/GM EX POWD: 1.0000 "application " | Freq: Three times a day (TID) | CUTANEOUS | 0 refills | Status: DC

## 2021-09-07 MED ORDER — ZOLMITRIPTAN 5 MG PO TABS: 5.0000 mg | ORAL_TABLET | Freq: Two times a day (BID) | ORAL | 0 refills | Status: DC | PRN

## 2021-09-07 MED ORDER — DEXAMETHASONE SODIUM PHOSPHATE 10 MG/ML IJ SOLN
10.0000 mg | Freq: Once | INTRAMUSCULAR | Status: AC
  Administered 2021-09-07: 10 mg via INTRAMUSCULAR

## 2021-09-07 MED ORDER — MICONAZOLE NITRATE 2 % EX CREA: 1.0000 "application " | TOPICAL_CREAM | Freq: Two times a day (BID) | CUTANEOUS | 0 refills | Status: DC

## 2021-09-07 MED ORDER — KETOROLAC TROMETHAMINE 60 MG/2ML IM SOLN
60.0000 mg | Freq: Once | INTRAMUSCULAR | Status: AC
  Administered 2021-09-07: 60 mg via INTRAMUSCULAR

## 2021-09-07 NOTE — Discharge Instructions (Addendum)
Take medications as prescribed.  Drink plenty of fluids.  Consume a low-salt diet.  Continue to monitor your blood pressure closely.  Go to the ED immediately if: Your headache becomes severe and medicine does not help. You have a stiff neck and/or fever. You have muscle weakness or loss of muscle control. You start losing your balance, or you have trouble walking. You feel like you may faint, or you faint. You have a seizure.

## 2021-09-07 NOTE — ED Provider Notes (Signed)
[facility]    CSN: [id number] Arrival date & time: 09/07/21  1548      History   Chief Complaint Chief Complaint  Patient presents with   Headache   Hypertension    HPI Kara Zamora is a 41 y.o. female.   Subjective:  Kara Zamora is a 41 y.o. female who presents for evaluation of headache. Symptoms began about 3 days ago. Rapidity of onset was gradual. The patient gets headaches rarely. The headache is described as throbbing and is occipital/temporal in location. The patient rates the pain a 8 on a scale from 1 to 10.  Headache has been waxing and waning since onset.  Precipitating factors include none which have been determined. The headache was not preceded by an aura. Patient denies any neurologic symptoms. No abdominal pain, chest pain, cough, dizziness, fatigue, fever, irritability, nausea, neck stiffness, rhinorrhea, sore throat, or vomiting. Home treatment has included Aleve with inadequate improvement.  No other pertinent history noted. Family history includes no known family members with significant headaches.  Notably, patient has also noted elevated blood pressure readings at home over the past few days.  Yesterday it was 168/117 and 184/113 last night.  Patient reports being on lisinopril for hypertension for several years and states that she is compliant with this regimen.  Patient also reports skin irritation underneath her breast.  She has had this before and was treated with steroid cream and antifungal cream in the past which was prescribed by her dermatologist. She did a virtual urgent care visit several days ago for this and was prescribed a cream that has not provided any help with her symptoms.  The following portions of the patient's history were reviewed and updated as appropriate: allergies, current medications, past family history, past medical history, past social history, past surgical history, and problem list.         Past Medical  History:  Diagnosis Date   Anti-phospholipid antibody syndrome (HCC)    Asthma    Blind    Hypertension    Stroke (HCC)    Tobacco abuse 05/30/2014    Patient Active Problem List   Diagnosis Date Noted   Stroke [facility])    Hypertension 12/27/2018   Blindness of both eyes 09/01/2018   Asthma 05/30/2014   Anti-phospholipid antibody syndrome (HCC) 05/30/2014   Tobacco abuse 05/30/2014    Past Surgical History:  Procedure Laterality Date   None      OB History   No obstetric history on file.      Home Medications    Prior to Admission medications   Medication Sig Start Date End Date Taking? Authorizing Provider  hydrochlorothiazide (HYDRODIURIL) 25 MG tablet Take 1 tablet (25 mg total) by mouth daily. 09/07/21  Yes Lurline Idol, FNP  miconazole (MICOTIN) 2 % cream Apply 1 application. topically 2 (two) times daily. Apply to affected areas twice a day 09/07/21  Yes Lurline Idol, FNP  nystatin (MYCOSTATIN/NYSTOP) powder Apply 1 application. topically 3 (three) times daily. Apply to affected areas three times a day 09/07/21  Yes Lurline Idol, FNP  zolmitriptan (ZOMIG) 5 MG tablet Take 1 tablet (5 mg total) by mouth 2 (two) times daily as needed (headache). 09/07/21  Yes Lurline Idol, FNP  albuterol (PROVENTIL HFA;VENTOLIN HFA) 108 (90 BASE) MCG/ACT inhaler Inhale 1-2 puffs into the lungs every 6 (six) hours as needed for wheezing or shortness of breath. Patient taking differently: Inhale 2 puffs into the lungs 4 (four) times daily. 06/01/14  Mikhail, Nita Sells, DO  buPROPion [facility] SR) 150 MG 12 hr tablet Take 1 tablet by mouth 2 (two) times daily. 07/20/18   [provider]  lisinopril (ZESTRIL) 20 MG tablet TAKE 1 TABLET BY MOUTH EVERY DAY 08/10/21   Marjie Skiff E, PA-C  norethindrone-ethinyl estradiol 1/35 (ORTHO-NOVUM, NORTREL,CYCLAFEM) tablet Take 1 tablet by mouth daily.    [provider]  traZODone (DESYREL) 50 MG tablet Take 50 mg by mouth  at bedtime. 08/18/18   [provider]  valACYclovir (VALTREX) 500 MG tablet Take 500 mg by mouth daily.    [provider]  warfarin (COUMADIN) 4 MG tablet TAKE 1-2 tablets Daily or as prescribed by Coumadin Clinic. 08/05/21   Rollene Rotunda, MD    Family History History reviewed. No pertinent family history.  Social History Social History   Tobacco Use   Smoking status: Every Day    Types: Cigarettes    Last attempt to quit: 04/04/2014    Years since quitting: 7.4   Smokeless tobacco: Never  Vaping Use   Vaping Use: Never used  Substance Use Topics   Alcohol use: Yes    Comment: occ.   Drug use: No     Allergies   Patient has no known allergies.   Review of Systems Review of Systems  Constitutional:  Negative for activity change, appetite change and fever.  HENT:  Negative for congestion, rhinorrhea, sinus pressure, sinus pain and sore throat.   Respiratory:  Negative for cough and shortness of breath.   Cardiovascular:  Negative for chest pain, palpitations and leg swelling.  Gastrointestinal:  Negative for abdominal pain, diarrhea, nausea and vomiting.  Musculoskeletal:  Negative for gait problem, neck pain and neck stiffness.  Skin:  Positive for rash.  Neurological:  Positive for headaches. Negative for dizziness, tremors, seizures, syncope, speech difficulty, weakness, light-headedness and numbness.  All other systems reviewed and are negative.   Physical Exam Triage Vital Signs ED Triage Vitals  Enc Vitals Group     BP 09/07/21 1753 (!) 166/103     Pulse Rate 09/07/21 1753 94     Resp 09/07/21 1753 20     Temp 09/07/21 1753 98.2 F (36.8 C)     Temp src --      SpO2 09/07/21 1753 98 %     Weight --      Height --      Head Circumference --      Peak Flow --      Pain Score 09/07/21 1755 7     Pain Loc --      Pain Edu? --      Excl. in GC? --    No data found.  Updated Vital Signs BP (!) 166/103   Pulse 94   Temp 98.2 F  (36.8 C)   Resp 20   SpO2 98%   Visual Acuity Right Eye Distance:   Left Eye Distance:   Bilateral Distance:    Right Eye Near:   Left Eye Near:    Bilateral Near:     Physical Exam Vitals reviewed.  Constitutional:      Appearance: She is well-developed. She is obese.  HENT:     Head: Normocephalic.     Mouth/Throat:     Mouth: Mucous membranes are moist.  Eyes:     Comments: UTA. Blind   Cardiovascular:     Rate and Rhythm: Normal rate.     Heart sounds: Normal heart sounds.  Pulmonary:  Effort: Pulmonary effort is normal.  Abdominal:     Palpations: Abdomen is soft.  Musculoskeletal:        General: Normal range of motion.     Cervical back: Normal range of motion and neck supple. No rigidity.  Skin:    General: Skin is warm and dry.     Comments: Moist, beefy homogeneous patch underneath pendulous breast without any erythema or edema.  No drainage.   Neurological:     Mental Status: She is alert.     GCS: GCS eye subscore is 4. GCS verbal subscore is 5. GCS motor subscore is 6.     Cranial Nerves: No cranial nerve deficit or facial asymmetry.     Sensory: No sensory deficit.     Motor: No weakness.     Coordination: Coordination normal.     Gait: Gait normal.  Psychiatric:        Mood and Affect: Mood normal.        Speech: Speech normal.        Behavior: Behavior normal.     UC Treatments / Results  Labs (all labs ordered are listed, but only abnormal results are displayed) Labs Reviewed - No data to display  EKG   Radiology No results found.  Procedures Procedures (including critical care time)  Medications Ordered in UC Medications  ketorolac (TORADOL) injection 60 mg (60 mg Intramuscular Given 09/07/21 1916)  dexamethasone (DECADRON) injection 10 mg (10 mg Intramuscular Given 09/07/21 1918)    Initial Impression / Assessment and Plan / UC Course  I have reviewed the triage vital signs and the nursing notes.  Pertinent labs & imaging  results that were available during my care of the patient were reviewed by me and considered in my medical decision making (see chart for details).    41 year old obese female with history of hypertension as well as a blood disorder on chronic warfarin presents with headache, elevated blood pressure and intertrigo under the breast.  Patient has no red flag headache symptoms at this time.  Blood pressure is elevated at 166/103.  No evidence of hypertensive urgency or hypertensive emergency.  Patient received Decadron and Toradol in the clinic.  Scription for Zomig provided.  Patient advised to monitor blood pressure closely and provided a BP log.  She should follow-up with her primary care in the next 3 days for recheck in the interim, I have added hydrochlorothiazide 25 mg daily to her current regimen of lisinopril.  Advised patient to adhere to a low-sodium diet.  Additionally, patient was prescribed miconazole and nystatin powder for her intertrigo.  Patient advised to keep area underneath her pendulous breasts dry at all times.  She will follow-up with her dermatologist if this continues to become problematic.  Discussed diagnosis, treatment, need for follow-up and indications for immediate ED evaluation with patient and family.  Today's evaluation has revealed no signs of a dangerous process. Discussed diagnosis with patient and/or guardian. Patient and/or guardian aware of their diagnosis, possible red flag symptoms to watch out for and need for close follow up. Patient and/or guardian understands verbal and written discharge instructions. Patient and/or guardian comfortable with plan and disposition.  Patient and/or guardian has a clear mental status at this time, good insight into illness (after discussion and teaching) and has clear judgment to make decisions regarding their care  Documentation was completed with the aid of voice recognition software. Transcription may contain typographical  errors. Final Clinical Impressions(s) / UC Diagnoses  Final diagnoses:  Elevated blood pressure reading in office with diagnosis of hypertension  Acute intractable headache, unspecified headache type  Intertrigo     Discharge Instructions      Take medications as prescribed.  Drink plenty of fluids.  Consume a low-salt diet.  Continue to monitor your blood pressure closely.  Go to the ED immediately if: Your headache becomes severe and medicine does not help. You have a stiff neck and/or fever. You have muscle weakness or loss of muscle control. You start losing your balance, or you have trouble walking. You feel like you may faint, or you faint. You have a seizure.     ED Prescriptions     Medication Sig Dispense Auth. Provider   miconazole (MICOTIN) 2 % cream Apply 1 application. topically 2 (two) times daily. Apply to affected areas twice a day 28 g Lurline IdolMurrill, Brandis Matsuura, FNP   zolmitriptan (ZOMIG) 5 MG tablet Take 1 tablet (5 mg total) by mouth 2 (two) times daily as needed (headache). 10 tablet Lurline IdolMurrill, Natally Ribera, FNP   hydrochlorothiazide (HYDRODIURIL) 25 MG tablet Take 1 tablet (25 mg total) by mouth daily. 14 tablet Lurline IdolMurrill, Jasmin Winberry, FNP   nystatin (MYCOSTATIN/NYSTOP) powder Apply 1 application. topically 3 (three) times daily. Apply to affected areas three times a day 30 g Lurline IdolMurrill, Nicholi Ghuman, FNP      PDMP not reviewed this encounter.   Lurline IdolMurrill, Mykeria Garman, [place]NP 09/07/21 1930

## 2021-09-07 NOTE — ED Triage Notes (Signed)
Pt here with a headache starting at base of neck and high blood pressure readings for the last 3 day. Pt also c/o skin irritation under breasts.

## 2021-09-08 ENCOUNTER — Telehealth: Payer: Self-pay | Admitting: Emergency Medicine

## 2021-09-10 ENCOUNTER — Telehealth: Payer: Self-pay

## 2021-09-10 NOTE — Telephone Encounter (Signed)
Lpmtcb and schedule INR check 

## 2021-09-22 ENCOUNTER — Telehealth: Payer: Self-pay

## 2021-09-22 NOTE — Telephone Encounter (Signed)
Lpmtcb and schedule INR check 

## 2021-09-27 ENCOUNTER — Other Ambulatory Visit: Payer: Self-pay | Admitting: Cardiology

## 2021-09-27 DIAGNOSIS — R760 Raised antibody titer: Secondary | ICD-10-CM

## 2021-09-27 DIAGNOSIS — Z7901 Long term (current) use of anticoagulants: Secondary | ICD-10-CM

## 2021-09-27 DIAGNOSIS — D6861 Antiphospholipid syndrome: Secondary | ICD-10-CM

## 2021-10-02 ENCOUNTER — Ambulatory Visit
Admission: EM | Admit: 2021-10-02 | Discharge: 2021-10-02 | Disposition: A | Discharge status: Home / Self Care | Payer: Medicare Other | Attending: Family Medicine | Admitting: Family Medicine

## 2021-10-02 DIAGNOSIS — J4521 Mild intermittent asthma with (acute) exacerbation: Secondary | ICD-10-CM | POA: Diagnosis not present

## 2021-10-02 MED ORDER — PREDNISONE 20 MG PO TABS: 40.0000 mg | ORAL_TABLET | Freq: Every day | ORAL | 0 refills | Status: DC

## 2021-10-02 MED ORDER — DEXAMETHASONE SODIUM PHOSPHATE 10 MG/ML IJ SOLN
10.0000 mg | Freq: Once | INTRAMUSCULAR | Status: AC
  Administered 2021-10-02: 10 mg via INTRAMUSCULAR

## 2021-10-02 NOTE — ED Triage Notes (Signed)
Pt c/o headache, burning to her eyes, chest tightness (hx of asthma), SOB, and nausea. She states her inhaler is not helping. Started: yesterday Home interventions: albuterol inhaler

## 2021-10-03 NOTE — ED Provider Notes (Signed)
[facility]    CSN: [id number] Arrival date & time: 10/02/21  1928      History   Chief Complaint Chief Complaint  Patient presents with   Shortness of Breath   Headache    HPI Kara Zamora is a 41 y.o. female.   HPI Patient with a history of chronic tobacco use, asthma , and hypertension presents today for burning of eyes, chest tightness, wheezing, SOB, and nausea since earlier today.   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HCC)    Asthma    Blind    Hypertension    Stroke [facility])    Tobacco abuse 05/30/2014    Patient Active Problem List   Diagnosis Date Noted   Stroke [facility])    Hypertension 12/27/2018   Blindness of both eyes 09/01/2018   Asthma 05/30/2014   Anti-phospholipid antibody syndrome (HCC) 05/30/2014   Tobacco abuse 05/30/2014    Past Surgical History:  Procedure Laterality Date   None      OB History   No obstetric history on file.      Home Medications    Prior to Admission medications   Medication Sig Start Date End Date Taking? Authorizing Provider  predniSONE (DELTASONE) 20 MG tablet Take 2 tablets (40 mg total) by mouth daily with breakfast. 10/02/21  Yes Bing Neighbors, FNP  albuterol (PROVENTIL HFA;VENTOLIN HFA) 108 (90 BASE) MCG/ACT inhaler Inhale 1-2 puffs into the lungs every 6 (six) hours as needed for wheezing or shortness of breath. Patient taking differently: Inhale 2 puffs into the lungs 4 (four) times daily. 06/01/14   Mikhail, Nita Sells, DO  buPROPion (WELLBUTRIN SR) 150 MG 12 hr tablet Take 1 tablet by mouth 2 (two) times daily. 07/20/18   [provider]  hydrochlorothiazide (HYDRODIURIL) 25 MG tablet Take 1 tablet (25 mg total) by mouth daily. 09/07/21   Lurline Idol, FNP  lisinopril (ZESTRIL) 20 MG tablet TAKE 1 TABLET BY MOUTH EVERY DAY 08/10/21   Marjie Skiff E, PA-C  miconazole (MICOTIN) 2 % cream Apply 1 application. topically 2 (two) times daily. Apply  to affected areas twice a day 09/07/21   Lurline Idol, FNP  norethindrone-ethinyl estradiol 1/35 (ORTHO-NOVUM, NORTREL,CYCLAFEM) tablet Take 1 tablet by mouth daily.    [provider]  nystatin (MYCOSTATIN/NYSTOP) powder Apply 1 application. topically 3 (three) times daily. Apply to affected areas three times a day 09/07/21   Lurline Idol, FNP  traZODone (DESYREL) 50 MG tablet Take 50 mg by mouth at bedtime. 08/18/18   [provider]  valACYclovir (VALTREX) 500 MG tablet Take 500 mg by mouth daily.    [provider]  warfarin (COUMADIN) 4 MG tablet TAKE 1-2 tablets Daily or as prescribed by [facility]madin Clinic. NEEDS INR CHECK FOR REFILLS 09/28/21   Rollene Rotunda, MD  zolmitriptan (ZOMIG) 5 MG tablet Take 1 tablet (5 mg total) by mouth 2 (two) times daily as needed (headache). 09/07/21   Lurline Idol, FNP    Family History History reviewed. No pertinent family history.  Social History Social History   Tobacco Use   Smoking status: Every Day    Types: Cigarettes    Last attempt to quit: 04/04/2014    Years since quitting: 7.5   Smokeless tobacco: Never  Vaping Use   Vaping Use: Never used  Substance Use Topics   Alcohol use: Yes    Comment: occ.   Drug use: No     Allergies   Patient  has no known allergies.   Review of Systems Review of Systems   Physical Exam Triage Vital Signs ED Triage Vitals  Enc Vitals Group     BP 10/02/21 1951 (!) 164/94     Pulse Rate 10/02/21 1951 99     Resp 10/02/21 1951 20     Temp 10/02/21 1951 98.2 F (36.8 C)     Temp Source 10/02/21 1951 Oral     SpO2 10/02/21 1951 95 %     Weight --      Height --      Head Circumference --      Peak Flow --      Pain Score 10/02/21 1949 7     Pain Loc --      Pain Edu? --      Excl. in GC? --    No data found.  Updated Vital Signs BP (!) 164/94 (BP Location: Left Arm)   Pulse 99   Temp 98.2 F (36.8 C) (Oral)   Resp 20   LMP 09/17/2021 (Approximate)    SpO2 95%   Visual Acuity Right Eye Distance:   Left Eye Distance:   Bilateral Distance:    Right Eye Near:   Left Eye Near:    Bilateral Near:     Physical Exam   UC Treatments / Results  Labs (all labs ordered are listed, but only abnormal results are displayed) Labs Reviewed - No data to display  EKG   Radiology No results found.  Procedures Procedures (including critical care time)  Medications Ordered in UC Medications  dexamethasone (DECADRON) injection 10 mg (10 mg Intramuscular Given 10/02/21 2034)    Initial Impression / Assessment and Plan / UC Course  I have reviewed the triage vital signs and the nursing notes.  Pertinent labs & imaging results that were available during my care of the patient were reviewed by me and considered in my medical decision making (see chart for details).     *** Final Clinical Impressions(s) / UC Diagnoses   Final diagnoses:  Intermittent asthma with acute exacerbation, unspecified asthma severity   Discharge Instructions   None    ED Prescriptions     Medication Sig Dispense Auth. Provider   predniSONE (DELTASONE) 20 MG tablet Take 2 tablets (40 mg total) by mouth daily with breakfast. 10 tablet Bing Neighbors, FNP      PDMP not reviewed this encounter.

## 2021-10-04 ENCOUNTER — Other Ambulatory Visit: Payer: Self-pay | Admitting: Cardiology

## 2021-10-04 DIAGNOSIS — D6861 Antiphospholipid syndrome: Secondary | ICD-10-CM

## 2021-10-04 DIAGNOSIS — Z7901 Long term (current) use of anticoagulants: Secondary | ICD-10-CM

## 2021-10-04 DIAGNOSIS — R760 Raised antibody titer: Secondary | ICD-10-CM

## 2021-10-05 NOTE — Telephone Encounter (Signed)
Pt is overdue for Anticoagulation Appt. Last seen on 07/30/2021 and was due on 09/10/2021. Last refill sent on 09/28/21 with 20 tabs.   Called pt and was sent to voicemail, left a message to call back regarding an appt, left main number and [facility] number to get scheduled

## 2021-10-07 NOTE — Telephone Encounter (Signed)
Called pt since she is overdue for her Anticoagulation Appt, she is taking a monitored medication and needs an appt. Left another message for her to call back regarding an appointment.

## 2021-10-08 NOTE — Telephone Encounter (Signed)
Called pt since she is overdue for Anticoagulation Appt; left a message for the pt to call back. Unable to process warfarin refill without being monitored on a monitored medication.

## 2021-10-20 ENCOUNTER — Ambulatory Visit (INDEPENDENT_AMBULATORY_CARE_PROVIDER_SITE_OTHER): Payer: Medicare Other | Admitting: *Deleted

## 2021-10-20 DIAGNOSIS — R760 Raised antibody titer: Secondary | ICD-10-CM | POA: Diagnosis not present

## 2021-10-20 DIAGNOSIS — Z7901 Long term (current) use of anticoagulants: Secondary | ICD-10-CM | POA: Diagnosis not present

## 2021-10-20 LAB — POCT INR: INR: 2.3 (ref 2.0–3.0)

## 2021-10-20 NOTE — Patient Instructions (Addendum)
Description   NO PRINTOUT NEEDED/USES RECORDER. Continue taking 2 tablets daily except 1 tablet on Monday, Wednesday and Friday.  INR in 6 weeks

## 2021-12-01 ENCOUNTER — Ambulatory Visit: Payer: Medicare Other | Attending: Cardiovascular Disease

## 2021-12-01 ENCOUNTER — Telehealth: Payer: Self-pay

## 2021-12-01 NOTE — Telephone Encounter (Signed)
Lpmtcb and reschedule INR appt. 

## 2021-12-02 ENCOUNTER — Other Ambulatory Visit: Payer: Self-pay | Admitting: Student

## 2021-12-02 DIAGNOSIS — D6861 Antiphospholipid syndrome: Secondary | ICD-10-CM

## 2021-12-02 DIAGNOSIS — Z7901 Long term (current) use of anticoagulants: Secondary | ICD-10-CM

## 2021-12-02 DIAGNOSIS — R760 Raised antibody titer: Secondary | ICD-10-CM

## 2021-12-02 NOTE — Telephone Encounter (Signed)
Prescription refill request received for warfarin Lov: 03/03/21 Kara Zamora) Next INR check: 12/01/21 Warfarin tablet strength: 4mg   Pt overdue to have INR checked. Called pt, no answer. Left voicemail. Will continue to reach out to pt and schedule [facility]mad[facility] Clinic appt prior to refill.

## 2021-12-07 ENCOUNTER — Ambulatory Visit
Admission: EM | Admit: 2021-12-07 | Discharge: 2021-12-07 | Disposition: A | Discharge status: Home / Self Care | Payer: Medicare Other | Attending: Emergency Medicine | Admitting: Emergency Medicine

## 2021-12-07 DIAGNOSIS — J4541 Moderate persistent asthma with (acute) exacerbation: Secondary | ICD-10-CM | POA: Diagnosis not present

## 2021-12-07 DIAGNOSIS — Z20822 Contact with and (suspected) exposure to covid-19: Secondary | ICD-10-CM | POA: Diagnosis not present

## 2021-12-07 MED ORDER — ALBUTEROL SULFATE (2.5 MG/3ML) 0.083% IN NEBU
2.5000 mg | INHALATION_SOLUTION | Freq: Once | RESPIRATORY_TRACT | Status: AC
  Administered 2021-12-07: 2.5 mg via RESPIRATORY_TRACT

## 2021-12-07 MED ORDER — TRIAMCINOLONE ACETONIDE 40 MG/ML IJ SUSP
80.0000 mg | Freq: Once | INTRAMUSCULAR | Status: AC
  Administered 2021-12-07: 80 mg via INTRAMUSCULAR

## 2021-12-07 MED ORDER — TRELEGY ELLIPTA 200-62.5-25 MCG/ACT IN AEPB: 1.0000 | INHALATION_SPRAY | Freq: Every morning | RESPIRATORY_TRACT | 0 refills | Status: AC

## 2021-12-07 MED ORDER — ALBUTEROL SULFATE HFA 108 (90 BASE) MCG/ACT IN AERS: 2.0000 | INHALATION_SPRAY | Freq: Four times a day (QID) | RESPIRATORY_TRACT | 0 refills | Status: DC | PRN

## 2021-12-07 MED ORDER — METHYLPREDNISOLONE 4 MG PO TBPK: ORAL_TABLET | ORAL | 0 refills | Status: DC

## 2021-12-07 NOTE — ED Triage Notes (Signed)
The patient states Saturday she began having a cough and chest discomfort. The patient states the chest discomfort is worse with the cough.   Home interventions: none

## 2021-12-07 NOTE — ED Provider Notes (Signed)
[facility]    CSN: [id number] Arrival date & time: 12/07/21  1801    HISTORY   Chief Complaint  Patient presents with   Cough   HPI Kara Zamora is a pleasant, 41 y.o. female who presents to urgent care today. Patient presents to urgent care complaining of a 2-day history of increased cough that is nonproductive as well as sensation of pressure in her chest and mild shortness of breath.  Patient reports a history of asthma, not currently using any inhalers.  On arrival, patient has elevated blood pressure and oxygen saturation 96%, patient is audibly wheezing.  The history is provided by the patient.   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HCC)    Asthma    Blind    Hypertension    Stroke [facility])    Tobacco abuse 05/30/2014   Patient Active Problem List   Diagnosis Date Noted   Stroke [facility])    Hypertension 12/27/2018   Blindness of both eyes 09/01/2018   Asthma 05/30/2014   Anti-phospholipid antibody syndrome (HCC) 05/30/2014   Tobacco abuse 05/30/2014   Past Surgical History:  Procedure Laterality Date   None     OB History   No obstetric history on file.    Home Medications    Prior to Admission medications   Medication Sig Start Date End Date Taking? Authorizing Provider  albuterol (VENTOLIN HFA) 108 (90 Base) MCG/ACT inhaler Inhale 2 puffs into the lungs every 6 (six) hours as needed for wheezing or shortness of breath (Cough). 12/07/21  Yes Theadora Rama Scales, PA-C  Fluticasone-Umeclidin-Vilant (TRELEGY ELLIPTA) 200-62.5-25 MCG/ACT AEPB Inhale 1 puff into the lungs in the morning for 14 days. 12/07/21 12/21/21 Yes Theadora Rama Scales, PA-C  methylPREDNISolone (MEDROL DOSEPAK) 4 MG TBPK tablet Take 24 mg on day 1, 20 mg on day 2, 16 mg on day 3, 12 mg on day 4, 8 mg on day 5, 4 mg on day 6.  Take all tablets in each row at once, do not spread tablets out throughout the day. 12/07/21  Yes Theadora Rama Scales,  PA-C  buPROPion [facility] SR) 150 MG 12 hr tablet Take 1 tablet by mouth 2 (two) times daily. 07/20/18   [provider]  hydrochlorothiazide (HYDRODIURIL) 25 MG tablet Take 1 tablet (25 mg total) by mouth daily. 09/07/21   Lurline Idol, FNP  lisinopril (ZESTRIL) 20 MG tablet TAKE 1 TABLET BY MOUTH EVERY DAY 08/10/21   Marjie Skiff E, PA-C  miconazole (MICOTIN) 2 % cream Apply 1 application. topically 2 (two) times daily. Apply to affected areas twice a day 09/07/21   Lurline Idol, FNP  norethindrone-ethinyl estradiol 1/35 (ORTHO-NOVUM, NORTREL,CYCLAFEM) tablet Take 1 tablet by mouth daily.    [provider]  nystatin (MYCOSTATIN/NYSTOP) powder Apply 1 application. topically 3 (three) times daily. Apply to affected areas three times a day 09/07/21   Lurline Idol, FNP  traZODone (DESYREL) 50 MG tablet Take 50 mg by mouth at bedtime. 08/18/18   [provider]  valACYclovir (VALTREX) 500 MG tablet Take 500 mg by mouth daily.    [provider]  warfarin (COUMADIN) 4 MG tablet TAKE 1-2 tablets Daily or as prescribed by [facility]. NEEDS INR CHECK FOR REFILLS 09/28/21   Rollene Rotunda, MD  zolmitriptan (ZOMIG) 5 MG tablet Take 1 tablet (5 mg total) by mouth 2 (two) times daily as needed (headache). 09/07/21   Lurline Idol, FNP    Family History History  reviewed. No pertinent family history. Social History Social History   Tobacco Use   Smoking status: Every Day    Types: Cigarettes    Last attempt to quit: 04/04/2014    Years since quitting: 7.6   Smokeless tobacco: Never  Vaping Use   Vaping Use: Never used  Substance Use Topics   Alcohol use: Yes    Comment: occ.   Drug use: No   Allergies   Patient has no known allergies.  Review of Systems Review of Systems Pertinent findings revealed after performing a 14 point review of systems has been noted in the history of present illness.  Physical Exam Triage Vital Signs ED Triage  Vitals  Enc Vitals Group     BP 01/30/21 0827 (!) 147/82     Pulse Rate 01/30/21 0827 72     Resp 01/30/21 0827 18     Temp 01/30/21 0827 98.3 F (36.8 C)     Temp Source 01/30/21 0827 Oral     SpO2 01/30/21 0827 98 %     Weight --      Height --      Head Circumference --      Peak Flow --      Pain Score 01/30/21 0826 5     Pain Loc --      Pain Edu? --      Excl. in GC? --   No data found.  Updated Vital Signs BP (!) 160/96 (BP Location: Right Wrist)   Pulse 88   Temp 98.9 F (37.2 C) (Oral)   Resp 16   LMP 11/16/2021   SpO2 96%   Physical Exam Vitals and nursing note reviewed.  Constitutional:      General: She is not in acute distress.    Appearance: Normal appearance. She is not ill-appearing.  HENT:     Head: Normocephalic and atraumatic.     Right Ear: Hearing, tympanic membrane and ear canal normal.     Left Ear: Hearing, tympanic membrane and ear canal normal.     Nose: No rhinorrhea.     Right Nostril: No foreign body, epistaxis, septal hematoma or occlusion.     Left Nostril: No foreign body, epistaxis, septal hematoma or occlusion.     Right Turbinates: Enlarged and swollen.     Left Turbinates: Enlarged and swollen.  Eyes:     General: Lids are normal.        Right eye: No discharge.        Left eye: No discharge.     Extraocular Movements: Extraocular movements intact.     Conjunctiva/sclera: Conjunctivae normal.     Right eye: Right conjunctiva is not injected.     Left eye: Left conjunctiva is not injected.  Neck:     Trachea: Trachea and phonation normal. No tracheal tenderness.  Cardiovascular:     Rate and Rhythm: Normal rate and regular rhythm.     Pulses: Normal pulses.     Heart sounds: Normal heart sounds. No murmur heard.    No friction rub. No gallop.  Pulmonary:     Effort: Pulmonary effort is normal. Prolonged expiration present. No tachypnea, bradypnea, accessory muscle usage, respiratory distress or retractions.     Breath  sounds: Examination of the right-upper field reveals wheezing. Examination of the left-upper field reveals wheezing. Examination of the right-middle field reveals wheezing. Examination of the left-middle field reveals wheezing. Examination of the right-lower field reveals wheezing. Examination of the left-lower field reveals  wheezing. Wheezing present.  Chest:     Chest wall: No tenderness.  Abdominal:     General: Abdomen is flat. Bowel sounds are normal.     Palpations: Abdomen is soft.     Tenderness: There is no abdominal tenderness.     Hernia: No hernia is present.  Musculoskeletal:        General: Normal range of motion.     Cervical back: Normal range of motion and neck supple. No edema, erythema, rigidity or crepitus. No pain with movement. Normal range of motion.     Right lower leg: No edema.     Left lower leg: No edema.  Lymphadenopathy:     Cervical: No cervical adenopathy.  Skin:    General: Skin is warm and dry.     Findings: No erythema or rash.     Comments: Skin is warm and dry to touch, good turgor, no rash appreciated  Neurological:     General: No focal deficit present.     Mental Status: She is alert and oriented to person, place, and time.     Motor: Motor function is intact.     Coordination: Coordination is intact.     Gait: Gait is intact.     Deep Tendon Reflexes:     Reflex Scores:      Patellar reflexes are 2+ on the right side and 2+ on the left side. Psychiatric:        Attention and Perception: Attention and perception normal.        Mood and Affect: Mood normal.        Speech: Speech normal.        Behavior: Behavior normal. Behavior is cooperative.        Thought Content: Thought content normal.        Cognition and Memory: Cognition normal.        Judgment: Judgment normal.     Visual Acuity Right Eye Distance:   Left Eye Distance:   Bilateral Distance:    Right Eye Near:   Left Eye Near:    Bilateral Near:     [facility] Couse / Diagnostics  / Procedures:     Radiology No results found.  Procedures Procedures (including critical care time) EKG  Pending results:  Labs Reviewed  SARS CORONAVIRUS 2 BY RT PCR    Medications Ordered in UC: Medications  triamcinolone acetonide (KENALOG-40) injection 80 mg (80 mg Intramuscular Given 12/07/21 1914)  albuterol (PROVENTIL) (2.5 MG/3ML) 0.083% nebulizer solution 2.5 mg (2.5 mg Nebulization Given 12/07/21 1915)    [facility] Diagnoses / Final Clinical Impressions(s)   I have reviewed the triage vital signs and the nursing notes.  Pertinent labs & imaging results that were available during my care of the patient were reviewed by me and considered in my medical decision making (see chart for details).    Final diagnoses:  Exposure to COVID-19 virus  Acute severe exacerbation of moderate persistent asthma   Patient is requesting COVID testing which was performed at her request.  Patient reports being in contact with someone with COVID-19.  Believe that patient's current symptoms are most likely due to asthma exacerbation due to noncompliance with asthma medications.  Patient was provided with albuterol inhaler and a sample of Trelegy.  Patient advised to follow-up with her PCP for renewals of prescriptions and repeat evaluation to track her progress.  Patient was counseled about the importance of daily monitoring and management of asthma symptoms to prevent future  exacerbations.  Last GFR was greater than 59 in November 2022.  Patient would benefit from Paxlovid if COVID-19 test is positive.  ED Prescriptions     Medication Sig Dispense Auth. Provider   albuterol (VENTOLIN HFA) 108 (90 Base) MCG/ACT inhaler Inhale 2 puffs into the lungs every 6 (six) hours as needed for wheezing or shortness of breath (Cough). 18 g Theadora Rama Scales, PA-C   Fluticasone-Umeclidin-Vilant (TRELEGY ELLIPTA) 200-62.5-25 MCG/ACT AEPB Inhale 1 puff into the lungs in the morning for 14 days. 1 each Theadora Rama  Scales, PA-C   methylPREDNISolone (MEDROL DOSEPAK) 4 MG TBPK tablet Take 24 mg on day 1, 20 mg on day 2, 16 mg on day 3, 12 mg on day 4, 8 mg on day 5, 4 mg on day 6.  Take all tablets in each row at once, do not spread tablets out throughout the day. 21 tablet Theadora Rama Scales, PA-C      PDMP not reviewed this encounter.  Disposition Upon Discharge:  Condition: stable for discharge home Home: take medications as prescribed; routine discharge instructions as discussed; follow up as advised.  Patient presented with an acute illness with associated systemic symptoms and significant discomfort requiring urgent management. In my opinion, this is a condition that a prudent lay person (someone who possesses an average knowledge of health and medicine) may potentially expect to result in complications if not addressed urgently such as respiratory distress, impairment of bodily function or dysfunction of bodily organs.   Routine symptom specific, illness specific and/or disease specific instructions were discussed with the patient and/or caregiver at length.   As such, the patient has been evaluated and assessed, work-up was performed and treatment was provided in alignment with urgent care protocols and evidence based medicine.  Patient/parent/caregiver has been advised that the patient may require follow up for further testing and treatment if the symptoms continue in spite of treatment, as clinically indicated and appropriate.  If the patient was tested for COVID-19, Influenza and/or RSV, then the patient/parent/guardian was advised to isolate at home pending the results of his/her diagnostic coronavirus test and potentially longer if they're positive. I have also advised pt that if his/her COVID-19 test returns positive, it's recommended to self-isolate for at least 10 days after symptoms first appeared AND until fever-free for 24 hours without fever reducer AND other symptoms have improved or  resolved. Discussed self-isolation recommendations as well as instructions for household member/close contacts as per the [facility] and [place] [facility], and also gave patient the COVID packet with this information.  Patient/parent/caregiver has been advised to return to the [facility] or PCP in 3-5 days if no better; to PCP or the Emergency Department if new signs and symptoms develop, or if the current signs or symptoms continue to change or worsen for further workup, evaluation and treatment as clinically indicated and appropriate  The patient will follow up with their current PCP if and as advised. If the patient does not currently have a PCP we will assist them in obtaining one.   The patient may need specialty follow up if the symptoms continue, in spite of conservative treatment and management, for further workup, evaluation, consultation and treatment as clinically indicated and appropriate.  Patient/parent/caregiver verbalized understanding and agreement of plan as discussed.  All questions were addressed during visit.  Please see discharge instructions below for further details of plan.  Discharge Instructions:   Discharge Instructions      Your symptoms and my physical exam findings  are concerning for exacerbation of your underlying asthma.  It is important that you are consistent with taking your inhaled medications exactly as prescribed.      Please see the list below for recommended medications, dosages and frequencies to provide relief of your current symptoms:     ProAir, Ventolin, Proventil (albuterol): This inhaled medication contains a short acting beta agonist bronchodilator.  This medication works on the smooth muscle that opens and constricts of your airways by relaxing the muscle.  The result of relaxation of the smooth muscle is increased air movement and improved work of breathing.  This is a short acting medication that can be used every 4-6 hours as needed for increased work of breathing,  shortness of breath, wheezing and excessive coughing.  I have provided you with a prescription.    Trelegy (fluticasone, vilanterol and umeclidinium):  This inhaled medication contains a corticosteroid and long-acting form of albuterol.  The inhaled steroid and this medication  is not absorbed into the body and will not cause side effects such as increased blood sugar levels, irritability, sleeplessness or weight gain.  Inhaled corticosteroid are sort of like topical steroid creams but, as you can imagine, it is not practical to attempt to rub a steroid cream inside of your lungs.  The long-acting albuterol works similarly to the short acting albuterol found in your rescue inhaler but provides 24-hour relaxation of the smooth muscles that open and constrict your airways; your short acting rescue inhaler can only provide for a few hours this benefit for a few hours.  The third unique ingredient, umeclidinium, is an antimuscarinic and works similarly to your albuterol, providing long-acting relaxation of the smooth muscles in your airway.  Please feel free to continue using your short acting rescue inhaler as often as needed throughout the day for shortness of breath, wheezing, and cough.  I provided you with a sample.  You were tested for COVID-19 today.  This is a PCR test.  The result of your COVID-19 test will be posted to your MyChart once it is complete, typically this takes 6 to 12 hours.   If your COVID-19 test is positive, you will be contacted by phone and further recommendations will be provided for you.  Please discuss with the call back nurse whether or not you would benefit from antiviral therapy.      Please follow-up within the next 5-7 days either with your primary care provider or urgent care if your symptoms do not resolve.  If you do not have a primary care provider, we will assist you in finding one.   Thank you for visiting urgent care today.  We appreciate the opportunity to  participate in your care.     This office note has been dictated using Teaching laboratory technician.  Unfortunately, this method of dictation can sometimes lead to typographical or grammatical errors.  I apologize for your inconvenience in advance if this occurs.  Please do not hesitate to reach out to me if clarification is needed.      Theadora Rama Scales, PA-C 12/08/21 1311

## 2021-12-07 NOTE — Discharge Instructions (Addendum)
Your symptoms and my physical exam findings are concerning for exacerbation of your underlying asthma.  It is important that you are consistent with taking your inhaled medications exactly as prescribed.      Please see the list below for recommended medications, dosages and frequencies to provide relief of your current symptoms:     ProAir, Ventolin, Proventil (albuterol): This inhaled medication contains a short acting beta agonist bronchodilator.  This medication works on the smooth muscle that opens and constricts of your airways by relaxing the muscle.  The result of relaxation of the smooth muscle is increased air movement and improved work of breathing.  This is a short acting medication that can be used every 4-6 hours as needed for increased work of breathing, shortness of breath, wheezing and excessive coughing.  I have provided you with a prescription.    Trelegy (fluticasone, vilanterol and umeclidinium):  This inhaled medication contains a corticosteroid and long-acting form of albuterol.  The inhaled steroid and this medication  is not absorbed into the body and will not cause side effects such as increased blood sugar levels, irritability, sleeplessness or weight gain.  Inhaled corticosteroid are sort of like topical steroid creams but, as you can imagine, it is not practical to attempt to rub a steroid cream inside of your lungs.  The long-acting albuterol works similarly to the short acting albuterol found in your rescue inhaler but provides 24-hour relaxation of the smooth muscles that open and constrict your airways; your short acting rescue inhaler can only provide for a few hours this benefit for a few hours.  The third unique ingredient, umeclidinium, is an antimuscarinic and works similarly to your albuterol, providing long-acting relaxation of the smooth muscles in your airway.  Please feel free to continue using your short acting rescue inhaler as often as needed throughout the day for  shortness of breath, wheezing, and cough.  I provided you with a sample.  You were tested for COVID-19 today.  This is a PCR test.  The result of your COVID-19 test will be posted to your MyChart once it is complete, typically this takes 6 to 12 hours.   If your COVID-19 test is positive, you will be contacted by phone and further recommendations will be provided for you.  Please discuss with the call back nurse whether or not you would benefit from antiviral therapy.      Please follow-up within the next 5-7 days either with your primary care provider or urgent care if your symptoms do not resolve.  If you do not have a primary care provider, we will assist you in finding one.   Thank you for visiting urgent care today.  We appreciate the opportunity to participate in your care.

## 2021-12-08 LAB — SARS CORONAVIRUS 2 BY RT PCR: SARS Coronavirus 2 by RT PCR: NEGATIVE

## 2021-12-10 LAB — PROTIME-INR: INR: 1.9 — AB (ref <=1.20)

## 2021-12-11 ENCOUNTER — Ambulatory Visit (INDEPENDENT_AMBULATORY_CARE_PROVIDER_SITE_OTHER): Payer: Medicare Other

## 2021-12-11 DIAGNOSIS — Z5181 Encounter for therapeutic drug level monitoring: Secondary | ICD-10-CM

## 2021-12-11 NOTE — Patient Instructions (Signed)
Description   NO PRINTOUT NEEDED/USES RECORDER. Instructed pt to take 1.5 tablets today and then continue taking 2 tablets daily except 1 tablet on Monday, Wednesday and Friday.  Recheck INR in 4 weeks

## 2021-12-14 ENCOUNTER — Telehealth: Payer: Self-pay | Admitting: Cardiology

## 2021-12-14 DIAGNOSIS — R760 Raised antibody titer: Secondary | ICD-10-CM

## 2021-12-14 DIAGNOSIS — Z7901 Long term (current) use of anticoagulants: Secondary | ICD-10-CM

## 2021-12-14 DIAGNOSIS — D6861 Antiphospholipid syndrome: Secondary | ICD-10-CM

## 2021-12-14 MED ORDER — WARFARIN SODIUM 4 MG PO TABS: ORAL_TABLET | ORAL | 2 refills | Status: DC

## 2021-12-14 NOTE — Telephone Encounter (Signed)
Refill request for warfarin:  Last INR was 1.9 on 12/11/21 Next INR due on 01/12/22 LOV was 03/03/22  C Goodrich PA-C  Refill approved.

## 2021-12-14 NOTE — Telephone Encounter (Addendum)
*  STAT* If patient is at the pharmacy, call can be transferred to refill team.   1. Which medications need to be refilled? (please list name of each medication and dose if known) warfarin (COUMADIN) 4 MG tablet  2. Which pharmacy/location (including street and city if local pharmacy) is medication to be sent to? [id number] - [place], [place] - [address]  3. Do they need a 30 day or 90 day supply? 90 day supply   Patient is completely out of medication.

## 2022-01-12 ENCOUNTER — Ambulatory Visit: Payer: Medicare Other | Attending: Cardiology

## 2022-03-11 ENCOUNTER — Other Ambulatory Visit: Payer: Self-pay | Admitting: Cardiology

## 2022-03-11 DIAGNOSIS — Z7901 Long term (current) use of anticoagulants: Secondary | ICD-10-CM

## 2022-03-11 DIAGNOSIS — R760 Raised antibody titer: Secondary | ICD-10-CM

## 2022-03-11 DIAGNOSIS — D6861 Antiphospholipid syndrome: Secondary | ICD-10-CM

## 2022-03-11 NOTE — Telephone Encounter (Addendum)
Refill request for warfarin:  Pt is past due for INR appt.  Message to Pharmacy and scheduling pt needs appt before more refills can be given.

## 2022-03-12 ENCOUNTER — Other Ambulatory Visit: Payer: Self-pay | Admitting: Cardiology

## 2022-03-12 DIAGNOSIS — R760 Raised antibody titer: Secondary | ICD-10-CM

## 2022-03-12 DIAGNOSIS — Z7901 Long term (current) use of anticoagulants: Secondary | ICD-10-CM

## 2022-03-12 DIAGNOSIS — D6861 Antiphospholipid syndrome: Secondary | ICD-10-CM

## 2022-04-08 ENCOUNTER — Other Ambulatory Visit: Payer: Self-pay | Admitting: Cardiology

## 2022-04-08 DIAGNOSIS — Z7901 Long term (current) use of anticoagulants: Secondary | ICD-10-CM

## 2022-04-08 DIAGNOSIS — D6861 Antiphospholipid syndrome: Secondary | ICD-10-CM

## 2022-04-08 DIAGNOSIS — R760 Raised antibody titer: Secondary | ICD-10-CM

## 2022-04-12 ENCOUNTER — Ambulatory Visit (INDEPENDENT_AMBULATORY_CARE_PROVIDER_SITE_OTHER): Payer: Medicare Other | Admitting: Pulmonary Disease

## 2022-04-12 ENCOUNTER — Encounter: Payer: Self-pay | Admitting: Pulmonary Disease

## 2022-04-12 ENCOUNTER — Ambulatory Visit: Payer: Medicare Other

## 2022-04-12 VITALS — BP 126/84 | HR 85 | Ht 62.0 in | Wt 374.0 lb

## 2022-04-12 DIAGNOSIS — Z720 Tobacco use: Secondary | ICD-10-CM | POA: Diagnosis not present

## 2022-04-12 DIAGNOSIS — J4550 Severe persistent asthma, uncomplicated: Secondary | ICD-10-CM

## 2022-04-12 DIAGNOSIS — G4733 Obstructive sleep apnea (adult) (pediatric): Secondary | ICD-10-CM | POA: Diagnosis not present

## 2022-04-12 DIAGNOSIS — D6861 Antiphospholipid syndrome: Secondary | ICD-10-CM | POA: Diagnosis not present

## 2022-04-12 MED ORDER — BREZTRI AEROSPHERE 160-9-4.8 MCG/ACT IN AERO: 2.0000 | INHALATION_SPRAY | Freq: Two times a day (BID) | RESPIRATORY_TRACT | 0 refills | Status: DC

## 2022-04-12 NOTE — Patient Instructions (Addendum)
Severe shortness of breath and wheezing, presumed severe persistent asthma: Full pulmonary function test Chest x-ray CBC with differential Serum IgE Stop Breo Stop Symbicort Start taking Breztri 2 puffs twice a day no matter how you feel Use albuterol as needed for chest tightness wheezing or shortness of breath, we will provide a prescription for a nebulizer machine and albuterol nebulized you can use every 4 hours as needed at home For now I think it is reasonable to stay out of work given the number of exacerbations you have had in the dusty environment at work  Obstructive sleep apnea: Keep using CPAP nightly Will arrange for CPAP titration study since it has been 5 years since the last and you are having some daytime fatigue  Cigarette smoking; You need to make every effort to stop smoking cigarettes  We will see you back in 4 to 6 weeks to go over the results of these tests or sooner if needed.

## 2022-04-12 NOTE — Progress Notes (Signed)
Synopsis: Referred in January 2024 for asthma. 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  Asthma developed in adulthood.  Also has OSA.   Subjective:   PATIENT ID: Kara Zamora GENDER: female DOB: [birth year], MRN: [id number]   HPI  Chief Complaint  Patient presents with   Consult    Referred by PCP for history of asthma. States she has had asthma for the past few years. Recently started a job at [employer] has noticed an increase in her SOB and wheezing.     Kara Zamora is here to see me for asthma.  She has been blind since her 26's.    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  She has two dogs which she's had for years.  They try to keep her house clean without too much dust.  In her prior apartment she had more trouble breathing.   She has been hospitalized for shortness of breath.  She believes it was in 2016.    She has sleep apnea, on CPAP, but it's been years since her last sleep study.  She believes it was in 2019.   > she says that as long as she has been on the CPAP she feels OK, otherwise it's tiring > she says that the machine works, but she needs new masks > she gets her machine from [place],  she's concerned about about the quality of the materials she's been sent.   Record review: September 2023 emergency room record reviewed where the patient was seen in the context of acute bronchitis, discharged with Symbicort, albuterol, cefdinir  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[place])    Asthma    Blind    Hypertension    Stroke [facility])    Tobacco abuse 05/30/2014     No family history on file.   Social History   Socioeconomic History   Marital status: Married    Spouse name: Not on file   Number of children: Not on file   Years of education: Not on file   Highest education level: Not on file  Occupational History   Not on file  Tobacco Use   Smoking status: Every Day    Packs/day: 0.50    Types: Cigarettes   Smokeless tobacco: Never   Tobacco comments:    Smokes 2-3 cigs a day.   Vaping Use   Vaping Use: Never used  Substance and Sexual Activity   Alcohol use: Yes    Comment: occ.   Drug use: No   Sexual activity: Not on file  Other Topics Concern   Not on file  Social History Narrative  Lives alone.  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 18 g 0   amLODipine-benazepril (LOTREL) 5-20 MG capsule Take 1 capsule by mouth daily.     BREO ELLIPTA 100-25 MCG/ACT AEPB Inhale 1 puff into the lungs daily.     buPROPion (WELLBUTRIN SR) 150 MG 12 hr tablet Take 1 tablet by mouth 2 (two) times daily.      hydrochlorothiazide (HYDRODIURIL) 25 MG tablet Take 1 tablet (25 mg total) by mouth daily. 14 tablet 0   miconazole (MICOTIN) 2 % cream Apply 1 application. topically 2 (two) times daily. Apply to affected areas twice a day 28 g 0   montelukast (SINGULAIR) 10 MG tablet Take 10 mg by mouth daily.     nystatin (MYCOSTATIN/NYSTOP) powder Apply 1 application. topically 3 (three) times daily. Apply to affected areas three times a day 30 g 0   SYMBICORT 160-4.5 MCG/ACT inhaler Inhale 2 puffs into the lungs 2 (two) times daily.     valACYclovir (VALTREX) 500 MG tablet Take 500 mg by mouth daily.     warfarin (COUMADIN) 4 MG tablet TAKE 1-2 TABLETS DAILY OR AS PRESCRIBED BY COUMADIN CLINIC PAST DUE INR NEEDS APPOINTMENT FOR REFILL;  CALL OFFICE TO SCHEDULE INR CHECK 10 tablet 0   lisinopril (ZESTRIL) 20 MG tablet TAKE 1 TABLET BY MOUTH EVERY DAY 90 tablet 3   traZODone (DESYREL) 50 MG tablet Take 50 mg by mouth at bedtime.     zolmitriptan (ZOMIG) 5 MG tablet Take 1 tablet (5 mg total) by mouth 2 (two) times daily as needed (headache). 10 tablet 0   methylPREDNISolone (MEDROL DOSEPAK) 4 MG TBPK tablet Take 24 mg on day 1, 20 mg on day 2, 16 mg on day 3, 12 mg on day 4, 8 mg on day 5, 4 mg on day 6.  Take all tablets in each row at once, do not spread tablets out throughout the day. 21 tablet 0   norethindrone-ethinyl estradiol 1/35 (ORTHO-NOVUM, NORTREL,CYCLAFEM) tablet Take 1 tablet by mouth daily.     No facility-administered medications prior to visit.    Review of Systems  Constitutional:  Negative for chills, fever, malaise/fatigue and weight loss.  HENT:  Negative for congestion, nosebleeds, sinus pain and sore throat.   Eyes:  Negative for photophobia, pain and discharge.  Respiratory:  Positive for cough, sputum production and shortness of breath. Negative for hemoptysis and wheezing.   Cardiovascular:  Negative for chest pain, palpitations, orthopnea and leg swelling.  Gastrointestinal:   Negative for abdominal pain, constipation, diarrhea, nausea and vomiting.  Genitourinary:  Negative for dysuria, frequency, hematuria and urgency.  Musculoskeletal:  Negative for back pain, joint pain, myalgias and neck pain.  Skin:  Negative for itching and rash.  Neurological:  Negative for tingling, tremors, sensory change, speech change, focal weakness, seizures, weakness and headaches.  Psychiatric/Behavioral:  Negative for memory loss, substance abuse and suicidal ideas. The patient is not nervous/anxious.  Objective:  Physical Exam   Vitals:   04/12/22 0912  BP: 126/84  Pulse: 85  SpO2: 98%  Weight: (!) 374 lb (169.6 kg)  Height: 5\' 2"  (1.575 m)    Gen: well appearing, no acute distress HENT: NCAT, OP clear, neck supple without masses Eyes: PERRL, EOMi Lymph: no cervical lymphadenopathy PULM: Wheezing bilaterally CV: RRR, no mgr, no JVD GI: BS+, soft, nontender, no hsm Derm: no rash or skin breakdown MSK: normal bulk and tone Neuro: A&Ox4, CN II-XII intact, strength 5/5 in all 4 extremities Psyche: normal mood and affect   CBC    Component Value Date/Time   WBC 11.1 (H) 03/31/2017 0803   RBC 4.48 03/31/2017 0803   HGB 12.6 03/31/2017 0803   HCT 38.6 03/31/2017 0803   PLT 352 03/31/2017 0803   MCV 86.2 03/31/2017 0803   MCH 28.1 03/31/2017 0803   MCHC 32.6 03/31/2017 0803   RDW 13.6 03/31/2017 0803   LYMPHSABS 1.9 07/04/2015 0227   MONOABS 1.2 (H) 07/04/2015 0227   EOSABS 0.1 07/04/2015 0227   BASOSABS 0.0 07/04/2015 0227     Chest imaging: September 2023 chest x-ray interpreted as clear lungs, elevated right hemidiaphragm  PFT:  Labs:  Path:  Echo:  Heart Catheterization:       Assessment & Plan:   Severe persistent asthma without complication  OSA (obstructive sleep apnea)  Anti-phospholipid antibody syndrome (HCC)  Tobacco abuse  Discussion: Alyssa has severe persistent asthma, some wheezing on physical exam today.  She  has had recurrent exacerbations over the last several months which may be due to her work environment, difficult to say.  Obesity certainly contributes to the severity of her symptoms.  At this time I suppose the diagnosis of severe persistent asthma is accurate though we do not have any information to back that up.  I would like to have lung function testing to better understand the syndrome to ensure that there is evidence of airflow obstruction, airflow reversibility, or other signs of obstructive lung disease.  It is also entirely possible that her dyspnea is due to her obesity and physical deconditioning.  Plan: Severe shortness of breath and wheezing, presumed severe persistent asthma: Full pulmonary function test Chest x-ray CBC with differential Serum IgE Stop Breo Stop Symbicort Start taking Breztri 2 puffs twice a day no matter how you feel Use albuterol as needed for chest tightness wheezing or shortness of breath, we will provide a prescription for a nebulizer machine and albuterol nebulized you can use every 4 hours as needed at home For now I think it is reasonable to stay out of work given the number of exacerbations you have had in the dusty environment at work  Obstructive sleep apnea: Keep using CPAP nightly Will arrange for CPAP titration study since it has been 5 years since the last and you are having some daytime fatigue  Cigarette smoking; You need to make every effort to stop smoking cigarettes  We will see you back in 4 to 6 weeks to go over the results of these tests or sooner if needed.   Immunizations: Immunization History  Administered Date(s) Administered   PFIZER Comirnaty(Gray Top)Covid-19 Tri-Sucrose Vaccine 07/03/2020   Pneumococcal Polysaccharide-23 05/31/2014     Current Outpatient Medications:    albuterol (VENTOLIN HFA) 108 (90 Base) MCG/ACT inhaler, Inhale 2 puffs into the lungs every 6 (six) hours as needed for wheezing or shortness of breath  (Cough)., Disp: 18 g, Rfl: 0   amLODipine-benazepril (LOTREL)  5-20 MG capsule, Take 1 capsule by mouth daily., Disp: , Rfl:    BREO ELLIPTA 100-25 MCG/ACT AEPB, Inhale 1 puff into the lungs daily., Disp: , Rfl:    buPROPion (WELLBUTRIN SR) 150 MG 12 hr tablet, Take 1 tablet by mouth 2 (two) times daily., Disp: , Rfl:    hydrochlorothiazide (HYDRODIURIL) 25 MG tablet, Take 1 tablet (25 mg total) by mouth daily., Disp: 14 tablet, Rfl: 0   miconazole (MICOTIN) 2 % cream, Apply 1 application. topically 2 (two) times daily. Apply to affected areas twice a day, Disp: 28 g, Rfl: 0   montelukast (SINGULAIR) 10 MG tablet, Take 10 mg by mouth daily., Disp: , Rfl:    nystatin (MYCOSTATIN/NYSTOP) powder, Apply 1 application. topically 3 (three) times daily. Apply to affected areas three times a day, Disp: 30 g, Rfl: 0   SYMBICORT 160-4.5 MCG/ACT inhaler, Inhale 2 puffs into the lungs 2 (two) times daily., Disp: , Rfl:    valACYclovir (VALTREX) 500 MG tablet, Take 500 mg by mouth daily., Disp: , Rfl:    warfarin (COUMADIN) 4 MG tablet, TAKE 1-2 TABLETS DAILY OR AS PRESCRIBED BY COUMADIN CLINIC PAST DUE INR NEEDS APPOINTMENT FOR REFILL;  CALL OFFICE TO SCHEDULE INR CHECK, Disp: 10 tablet, Rfl: 0

## 2022-04-13 ENCOUNTER — Ambulatory Visit: Payer: Medicare Other

## 2022-04-19 ENCOUNTER — Ambulatory Visit (INDEPENDENT_AMBULATORY_CARE_PROVIDER_SITE_OTHER): Payer: Medicare Other

## 2022-04-19 ENCOUNTER — Ambulatory Visit: Payer: Medicare Other | Attending: Cardiology

## 2022-04-19 DIAGNOSIS — R760 Raised antibody titer: Secondary | ICD-10-CM

## 2022-04-19 DIAGNOSIS — Z7901 Long term (current) use of anticoagulants: Secondary | ICD-10-CM | POA: Diagnosis not present

## 2022-04-19 DIAGNOSIS — J4550 Severe persistent asthma, uncomplicated: Secondary | ICD-10-CM | POA: Diagnosis not present

## 2022-04-19 DIAGNOSIS — D6861 Antiphospholipid syndrome: Secondary | ICD-10-CM

## 2022-04-19 DIAGNOSIS — Z5181 Encounter for therapeutic drug level monitoring: Secondary | ICD-10-CM

## 2022-04-19 LAB — POCT INR: INR: 1 — AB (ref 2.0–3.0)

## 2022-04-19 MED ORDER — WARFARIN SODIUM 4 MG PO TABS: ORAL_TABLET | ORAL | 1 refills | Status: DC

## 2022-04-19 NOTE — Patient Instructions (Signed)
NO PRINTOUT NEEDED/USES RECORDER. Instructed pt to take 2 tablets today and tomorrow 3 tablets  then continue taking 2 tablets daily except 1 tablet on Monday, Wednesday and Friday.  Recheck INR in 1 week

## 2022-04-22 ENCOUNTER — Other Ambulatory Visit: Payer: Self-pay

## 2022-04-22 DIAGNOSIS — R0609 Other forms of dyspnea: Secondary | ICD-10-CM

## 2022-04-26 ENCOUNTER — Other Ambulatory Visit (INDEPENDENT_AMBULATORY_CARE_PROVIDER_SITE_OTHER): Payer: Medicare Other

## 2022-04-26 ENCOUNTER — Ambulatory Visit: Payer: Medicare Other | Attending: Internal Medicine | Admitting: *Deleted

## 2022-04-26 DIAGNOSIS — R760 Raised antibody titer: Secondary | ICD-10-CM | POA: Diagnosis not present

## 2022-04-26 DIAGNOSIS — Z7901 Long term (current) use of anticoagulants: Secondary | ICD-10-CM | POA: Diagnosis not present

## 2022-04-26 DIAGNOSIS — J4550 Severe persistent asthma, uncomplicated: Secondary | ICD-10-CM

## 2022-04-26 DIAGNOSIS — Z5181 Encounter for therapeutic drug level monitoring: Secondary | ICD-10-CM | POA: Diagnosis not present

## 2022-04-26 LAB — CBC WITH DIFFERENTIAL/PLATELET
Basophils Absolute: 0 10*3/uL (ref 0.0–0.1)
Basophils Relative: 0.3 % (ref 0.0–3.0)
Eosinophils Absolute: 0.1 10*3/uL (ref 0.0–0.7)
Eosinophils Relative: 1.2 % (ref 0.0–5.0)
HCT: 39.1 % (ref 36.0–46.0)
Hemoglobin: 12.7 g/dL (ref 12.0–15.0)
Lymphocytes Relative: 33.5 % (ref 12.0–46.0)
Lymphs Abs: 2.9 10*3/uL (ref 0.7–4.0)
MCHC: 32.6 g/dL (ref 30.0–36.0)
MCV: 87.9 fl (ref 78.0–100.0)
Monocytes Absolute: 0.7 10*3/uL (ref 0.1–1.0)
Monocytes Relative: 7.8 % (ref 3.0–12.0)
Neutro Abs: 5 10*3/uL (ref 1.4–7.7)
Neutrophils Relative %: 57.2 % (ref 43.0–77.0)
Platelets: 429 10*3/uL — ABNORMAL HIGH (ref 150.0–400.0)
RBC: 4.45 Mil/uL (ref 3.87–5.11)
RDW: 15.2 % (ref 11.5–15.5)
WBC: 8.7 10*3/uL (ref 4.0–10.5)

## 2022-04-26 LAB — POCT INR: POC INR: 2.2

## 2022-04-26 NOTE — Patient Instructions (Signed)
Description   NO PRINTOUT NEEDED/USES RECORDER. Instructed pt to continue taking 2 tablets daily except 1 tablet on Monday, Wednesday and Friday.  Recheck INR in 1 week

## 2022-04-27 LAB — IGE: IgE (Immunoglobulin E), Serum: 3 kU/L (ref <=114)

## 2022-04-30 ENCOUNTER — Ambulatory Visit (HOSPITAL_BASED_OUTPATIENT_CLINIC_OR_DEPARTMENT_OTHER): Payer: Medicare Other | Attending: Pulmonary Disease | Admitting: Pulmonary Disease

## 2022-05-05 ENCOUNTER — Ambulatory Visit: Payer: Medicare Other | Attending: Cardiology

## 2022-05-11 ENCOUNTER — Ambulatory Visit: Payer: Medicare Other | Attending: Internal Medicine

## 2022-05-11 ENCOUNTER — Other Ambulatory Visit: Payer: Self-pay | Admitting: Cardiology

## 2022-05-11 DIAGNOSIS — Z7901 Long term (current) use of anticoagulants: Secondary | ICD-10-CM

## 2022-05-11 DIAGNOSIS — R760 Raised antibody titer: Secondary | ICD-10-CM

## 2022-05-11 DIAGNOSIS — Z5181 Encounter for therapeutic drug level monitoring: Secondary | ICD-10-CM

## 2022-05-11 DIAGNOSIS — D6861 Antiphospholipid syndrome: Secondary | ICD-10-CM

## 2022-05-11 LAB — POCT INR: INR: 3.7 — AB (ref 2.0–3.0)

## 2022-05-11 NOTE — Patient Instructions (Signed)
NO PRINTOUT NEEDED/USES RECORDER. Instructed pt to [place] continue taking 2 tablets daily except 1 tablet on Monday, Wednesday and Friday.  Recheck INR in 3 weeks

## 2022-05-18 ENCOUNTER — Telehealth: Payer: Self-pay | Admitting: Pulmonary Disease

## 2022-05-18 ENCOUNTER — Ambulatory Visit (HOSPITAL_BASED_OUTPATIENT_CLINIC_OR_DEPARTMENT_OTHER): Payer: BC Managed Care – PPO | Attending: Pulmonary Disease | Admitting: Pulmonary Disease

## 2022-05-18 VITALS — Ht 62.25 in | Wt 365.0 lb

## 2022-05-18 DIAGNOSIS — G4733 Obstructive sleep apnea (adult) (pediatric): Secondary | ICD-10-CM | POA: Diagnosis present

## 2022-05-19 NOTE — Telephone Encounter (Signed)
Spoke with patient she stated since last work excuse she received she has not returned to work. She is requesting a note putting her out of work until mid next week.  Dr. Lake Bells please advise if this is okay?

## 2022-05-20 ENCOUNTER — Ambulatory Visit
Admission: RE | Admit: 2022-05-20 | Discharge: 2022-05-20 | Disposition: A | Discharge status: Home / Self Care | Payer: Medicare Other | Source: Ambulatory Visit | Attending: Pulmonary Disease | Admitting: Pulmonary Disease

## 2022-05-20 DIAGNOSIS — R0609 Other forms of dyspnea: Secondary | ICD-10-CM

## 2022-05-21 NOTE — Progress Notes (Signed)
Called the pt and there was no answer- LMTCB.  

## 2022-05-25 NOTE — Telephone Encounter (Signed)
ATC X1 LVM for patient to call the office back. Advised patient work note was ready and waiting at the front for her to pick up

## 2022-05-27 ENCOUNTER — Ambulatory Visit (INDEPENDENT_AMBULATORY_CARE_PROVIDER_SITE_OTHER): Payer: Medicare Other | Admitting: Pulmonary Disease

## 2022-05-27 ENCOUNTER — Encounter: Payer: Self-pay | Admitting: Pulmonary Disease

## 2022-05-27 ENCOUNTER — Encounter: Payer: Self-pay | Admitting: *Deleted

## 2022-05-27 VITALS — BP 126/72 | HR 97 | Ht 62.25 in | Wt 367.8 lb

## 2022-05-27 DIAGNOSIS — J4550 Severe persistent asthma, uncomplicated: Secondary | ICD-10-CM | POA: Diagnosis not present

## 2022-05-27 DIAGNOSIS — D6861 Antiphospholipid syndrome: Secondary | ICD-10-CM

## 2022-05-27 DIAGNOSIS — Z720 Tobacco use: Secondary | ICD-10-CM | POA: Diagnosis not present

## 2022-05-27 DIAGNOSIS — G4733 Obstructive sleep apnea (adult) (pediatric): Secondary | ICD-10-CM

## 2022-05-27 MED ORDER — BREZTRI AEROSPHERE 160-9-4.8 MCG/ACT IN AERO: 2.0000 | INHALATION_SPRAY | Freq: Two times a day (BID) | RESPIRATORY_TRACT | 5 refills | Status: DC

## 2022-05-27 NOTE — Progress Notes (Signed)
Synopsis: Referred in January 2024 for severe persistent asthma. 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  Asthma developed in adulthood.  Also has OSA.  Still smoking 3-4 cigarettes a day as of 05/2022.  Subjective:   PATIENT ID: Kara Zamora GENDER: female DOB: [birth year], MRN: [id number]   HPI  Chief Complaint  Patient presents with   Follow-up    Pt states she has been doing okay since last visit. States she has had some wheezing.   She is still wheezing She changed jobs to a different job where there isn't as much dust She wants Librarian, academic, it really helps.  She ran out of it recently though and her wheezing has increased a little bit. She is taking singulair at night Breathing has stablized, no bronchitis or pneumonia since the last visit She is still smoking, down to 3-4 cigarettes a day, on Wellbutritn. Using and benefiting from CPAP  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[place])    Asthma    Blind    Hypertension    Stroke [facility])    Tobacco abuse 05/30/2014     Review of Systems  Constitutional:  Negative for chills, fever, malaise/fatigue and weight loss.  HENT:  Negative for congestion, sinus pain and sore throat.   Respiratory:  Positive for cough and shortness of breath. Negative for sputum production.   Cardiovascular:  Negative for chest pain and leg swelling.      Objective:  Physical Exam   Vitals:   05/27/22 1514  BP: 126/72  Pulse: 97  SpO2: 98%  Weight: (!) 367 lb 12.8 oz (166.8 kg)  Height: 5' 2.25" (1.581 m)    Gen: obese, no acute distress HENT: OP clear, neck supple PULM: Wheezing bilaterally B, normal effort  CV: RRR, no mgr GI: BS+, soft, nontender Derm: no cyanosis or rash Psyche: normal mood and affect    CBC    Component Value Date/Time   WBC 8.7 04/26/2022 1442   RBC 4.45 04/26/2022 1442   HGB 12.7 04/26/2022 1442   HCT 39.1  04/26/2022 1442   PLT 429.0 (H) 04/26/2022 1442   MCV 87.9 04/26/2022 1442   MCH 28.1 03/31/2017 0803   MCHC 32.6 04/26/2022 1442   RDW 15.2 04/26/2022 1442   LYMPHSABS 2.9 04/26/2022 1442   MONOABS 0.7 04/26/2022 1442   EOSABS 0.1 04/26/2022 1442   BASOSABS 0.0 04/26/2022 1442     Chest imaging: September 2023 chest x-ray interpreted as clear lungs, elevated right hemidiaphragm 05/2022 High resolution CT chest > findings suggestive of small airways disease, no ILD  PFT:  Labs: 04/2022 eos 100k cell/uL, IgE 3  Path:  Echo:  Heart Catheterization:  Sleep study: 05/2022 CPAP titration> failed CPAP, required BIPAP 22/18 to improve symptoms     Assessment & Plan:   OSA (obstructive sleep apnea)  Severe persistent asthma without complication  Anti-phospholipid antibody syndrome (HCC)  Tobacco abuse  Discussion: 42 year old with severe persistent asthma exacerbated by obesity, cigarette smoking: Stable interval on breast tree but still smoking which is contributing to wheezing.  We talked about this today and she has cut back significantly but she is not quite ready to quit.  I congratulated her on the amount that she is cut back.  Plan: Severe persistent asthma: Continue Breztri 2 puffs daily no matter how you feel Use albuterol as needed for chest tightness wheezing or shortness of breath Practice good  hand hygiene Stay physically active Keep immunizations up-to-date  Cigarette smoking: Stop Call 1-800-quit-NOW to get free nicotine replacement from the state of [place]  Obstructive sleep apnea: Recent testing showed that she need to change from CPAP to BiPAP We will send in an order for BiPAP 22/8 Use this nightly No driving while sleepy Avoid caffeine after midnight Avoid more than 1 alcoholic beverage a day  We will see you back in 4 months, lung function testing on that visit.    Immunizations: Immunization History  Administered Date(s)  Administered   Influenza Inj Mdck Quad With Preservative 12/12/2021   Janssen (J&J) SARS-COV-2 Vaccination 06/21/2019   PFIZER Comirnaty(Gray Top)Covid-19 Tri-Sucrose Vaccine 07/03/2020   PNEUMOCOCCAL CONJUGATE-20 12/12/2021   Pneumococcal Polysaccharide-23 05/31/2014   Tdap 09/30/2017     Current Outpatient Medications:    albuterol (VENTOLIN HFA) 108 (90 Base) MCG/ACT inhaler, Inhale 2 puffs into the lungs every 6 (six) hours as needed for wheezing or shortness of breath (Cough)., Disp: 18 g, Rfl: 0   amLODipine-benazepril (LOTREL) 5-20 MG capsule, Take 1 capsule by mouth daily., Disp: , Rfl:    Budeson-Glycopyrrol-Formoterol (BREZTRI AEROSPHERE) 160-9-4.8 MCG/ACT AERO, Inhale 2 puffs into the lungs in the morning and at bedtime., Disp: 10.7 g, Rfl: 5   buPROPion (WELLBUTRIN SR) 150 MG 12 hr tablet, Take 1 tablet by mouth 2 (two) times daily., Disp: , Rfl:    hydrochlorothiazide (HYDRODIURIL) 25 MG tablet, Take 1 tablet (25 mg total) by mouth daily., Disp: 14 tablet, Rfl: 0   miconazole (MICOTIN) 2 % cream, Apply 1 application. topically 2 (two) times daily. Apply to affected areas twice a day, Disp: 28 g, Rfl: 0   montelukast (SINGULAIR) 10 MG tablet, Take 10 mg by mouth daily., Disp: , Rfl:    nystatin (MYCOSTATIN/NYSTOP) powder, Apply 1 application. topically 3 (three) times daily. Apply to affected areas three times a day, Disp: 30 g, Rfl: 0   valACYclovir (VALTREX) 500 MG tablet, Take 500 mg by mouth daily., Disp: , Rfl:    warfarin (COUMADIN) 4 MG tablet, TAKE 1 TO 2 TABLETS BY MOUTH DAILY OR AS PRESCRIBED BY COUMADIN CLINIC, Disp: 90 tablet, Rfl: 1

## 2022-05-27 NOTE — Addendum Note (Signed)
Addended by: Lorretta Harp on: 05/27/2022 03:49 PM   Modules accepted: Orders

## 2022-05-27 NOTE — Patient Instructions (Signed)
Severe persistent asthma: Continue Breztri 2 puffs daily no matter how you feel Use albuterol as needed for chest tightness wheezing or shortness of breath Practice good hand hygiene Stay physically active Keep immunizations up-to-date  Cigarette smoking: Stop Call 1-800-quit-NOW to get free nicotine replacement from the state of [place]  Obstructive sleep apnea: Recent testing showed that she need to change from CPAP to BiPAP We will send in an order for BiPAP 22/8 Use this nightly No driving while sleepy Avoid caffeine after midnight Avoid more than 1 alcoholic beverage a day  We will see you back in 4 months, lung function testing on that visit.

## 2022-05-27 NOTE — Addendum Note (Signed)
Addended by: Lorretta Harp on: 05/27/2022 03:46 PM   Modules accepted: Orders

## 2022-05-28 NOTE — Telephone Encounter (Signed)
Will close this encounter. Pt received work note at [employer] visit

## 2022-05-31 DIAGNOSIS — G4733 Obstructive sleep apnea (adult) (pediatric): Secondary | ICD-10-CM

## 2022-05-31 NOTE — Procedures (Signed)
     Patient Name: Kara Zamora, Kara Zamora Date: 05/18/2022 Gender: Female D.O.B: 04-03-1981 Age (years): 41 Referring Provider: Simonne Maffucci Height (inches): 62.25 Interpreting Physician: Chesley Mires MD, ABSM Weight (lbs): 365 RPSGT: Gwenyth Allegra BMI: 66 MRN: [id number] Neck Size: 21.00  CLINICAL INFORMATION The patient is referred for a CPAP titration to treat sleep apnea.  SLEEP STUDY TECHNIQUE As per the AASM Manual for the Scoring of Sleep and Associated Events v2.3 (April 2016) with a hypopnea requiring 4% desaturations.  The channels recorded and monitored were frontal, central and occipital EEG, electrooculogram (EOG), submentalis EMG (chin), nasal and oral airflow, thoracic and abdominal wall motion, anterior tibialis EMG, snore microphone, electrocardiogram, and pulse oximetry. Bilevel positive airway pressure (BPAP) was initiated at the beginning of the study and titrated to treat sleep-disordered breathing.  MEDICATIONS Medications self-administered by patient taken the night of the study : N/A  RESPIRATORY PARAMETERS Optimal CPAP Pressure (cm): 17 AHI at Optimal Pressure (/hr) 2.9 Overall Minimal O2 (%): 88.0 Minimal O2 at Optimal Pressure (%): 91.0  SLEEP ARCHITECTURE Start Time: 10:55:59 PM Stop Time: 4:57:03 AM Total Time (min): 361.1 Total Sleep Time (min): 331.8 Sleep Latency (min): 6.3 Sleep Efficiency (%): 91.9% REM Latency (min): 42.0 WASO (min): 23.0 Stage N1 (%): 2.2% Stage N2 (%): 65.4% Stage N3 (%): 0.0% Stage R (%): 32.4 Supine (%): 0.00 Arousal Index (/hr): 13.0   CARDIAC DATA The 2 lead EKG demonstrated sinus rhythm. The mean heart rate was 83.9 beats per minute. Other EKG findings include: None.  LEG MOVEMENT DATA The total Periodic Limb Movements of Sleep (PLMS) were 0. The PLMS index was 0.0. A PLMS index of <15 is considered normal in adults.  IMPRESSIONS - She did well with CPAP 17 cm H2O.  She was observed in REM but not supine  sleep at this pressure setting. - She did not need supplemental oxygen during this study.  DIAGNOSIS - Obstructive Sleep Apnea (G47.33)  RECOMMENDATIONS - Trial of CPAP therapy on 17 cm H2O with a Medium size Fisher&Paykel Full Face Simplus mask and heated humidification. - Avoid alcohol, sedatives and other CNS depressants that may worsen sleep apnea and disrupt normal sleep architecture. - Sleep hygiene should be reviewed to assess factors that may improve sleep quality. - Weight management and regular exercise should be initiated or continued.  [Electronically signed] 05/31/2022 04:56 PM  Chesley Mires MD, ABSM Diplomate, American Board of Sleep Medicine NPI: [id number]  [place] PH: [phone]   FX: [phone] [place]

## 2022-06-01 ENCOUNTER — Ambulatory Visit: Payer: Medicare Other | Attending: Cardiology

## 2022-06-14 DIAGNOSIS — M25561 Pain in right knee: Secondary | ICD-10-CM | POA: Diagnosis not present

## 2022-06-14 DIAGNOSIS — M129 Arthropathy, unspecified: Secondary | ICD-10-CM | POA: Diagnosis not present

## 2022-06-14 DIAGNOSIS — M25562 Pain in left knee: Secondary | ICD-10-CM | POA: Diagnosis not present

## 2022-06-14 DIAGNOSIS — Z131 Encounter for screening for diabetes mellitus: Secondary | ICD-10-CM | POA: Diagnosis not present

## 2022-06-14 DIAGNOSIS — Z1159 Encounter for screening for other viral diseases: Secondary | ICD-10-CM | POA: Diagnosis not present

## 2022-06-14 DIAGNOSIS — M545 Low back pain, unspecified: Secondary | ICD-10-CM | POA: Diagnosis not present

## 2022-06-14 DIAGNOSIS — E559 Vitamin D deficiency, unspecified: Secondary | ICD-10-CM | POA: Diagnosis not present

## 2022-06-14 DIAGNOSIS — Z79899 Other long term (current) drug therapy: Secondary | ICD-10-CM | POA: Diagnosis not present

## 2022-06-14 DIAGNOSIS — Z0000 Encounter for general adult medical examination without abnormal findings: Secondary | ICD-10-CM | POA: Diagnosis not present

## 2022-06-14 DIAGNOSIS — G8929 Other chronic pain: Secondary | ICD-10-CM | POA: Diagnosis not present

## 2022-06-21 DIAGNOSIS — H543 Unqualified visual loss, both eyes: Secondary | ICD-10-CM | POA: Diagnosis not present

## 2022-06-21 DIAGNOSIS — M25561 Pain in right knee: Secondary | ICD-10-CM | POA: Diagnosis not present

## 2022-06-21 DIAGNOSIS — M25562 Pain in left knee: Secondary | ICD-10-CM | POA: Diagnosis not present

## 2022-06-21 DIAGNOSIS — Z79899 Other long term (current) drug therapy: Secondary | ICD-10-CM | POA: Diagnosis not present

## 2022-06-21 DIAGNOSIS — M545 Low back pain, unspecified: Secondary | ICD-10-CM | POA: Diagnosis not present

## 2022-06-21 DIAGNOSIS — G8929 Other chronic pain: Secondary | ICD-10-CM | POA: Diagnosis not present

## 2022-06-23 DIAGNOSIS — Z79899 Other long term (current) drug therapy: Secondary | ICD-10-CM | POA: Diagnosis not present

## 2022-06-28 DIAGNOSIS — G4733 Obstructive sleep apnea (adult) (pediatric): Secondary | ICD-10-CM | POA: Diagnosis not present

## 2022-07-12 DIAGNOSIS — L304 Erythema intertrigo: Secondary | ICD-10-CM | POA: Diagnosis not present

## 2022-07-12 DIAGNOSIS — L710 Perioral dermatitis: Secondary | ICD-10-CM | POA: Diagnosis not present

## 2022-07-19 ENCOUNTER — Telehealth: Payer: Self-pay | Admitting: Cardiology

## 2022-07-19 DIAGNOSIS — D6861 Antiphospholipid syndrome: Secondary | ICD-10-CM

## 2022-07-19 DIAGNOSIS — R760 Raised antibody titer: Secondary | ICD-10-CM

## 2022-07-19 DIAGNOSIS — Z7901 Long term (current) use of anticoagulants: Secondary | ICD-10-CM

## 2022-07-19 MED ORDER — WARFARIN SODIUM 4 MG PO TABS: ORAL_TABLET | ORAL | 0 refills | Status: DC

## 2022-07-19 NOTE — Telephone Encounter (Signed)
Warfarin 4mg  refill Dx-antiphospholipid antibody syndrome with prior stroke  Last INR 05/11/22 & has pending appt 07/26/22 Last OV 03/03/21-PLACED A NOTE ON ANTICOAG APPT TO SCHEDULE AN APPT. 1 WEEK SUPPLY SENT

## 2022-07-19 NOTE — Telephone Encounter (Signed)
*  STAT* If patient is at the pharmacy, call can be transferred to refill team.   1. Which medications need to be refilled? (please list name of each medication and dose if known) warfarin (COUMADIN) 4 MG tablet [[id number]]   2. Which pharmacy/location (including street and city if local pharmacy) is medication to be sent to? [id number] - [place], [place] - [address]   3. Do they need a 30 day or 90 day supply? Pt stated she needs atleast 1 week refill to make it to her coumadin appt Monday 4/22.    Best number  [phone]

## 2022-07-22 DIAGNOSIS — M545 Low back pain, unspecified: Secondary | ICD-10-CM | POA: Diagnosis not present

## 2022-07-22 DIAGNOSIS — G8929 Other chronic pain: Secondary | ICD-10-CM | POA: Diagnosis not present

## 2022-07-22 DIAGNOSIS — H543 Unqualified visual loss, both eyes: Secondary | ICD-10-CM | POA: Diagnosis not present

## 2022-07-22 DIAGNOSIS — M25562 Pain in left knee: Secondary | ICD-10-CM | POA: Diagnosis not present

## 2022-07-22 DIAGNOSIS — M25561 Pain in right knee: Secondary | ICD-10-CM | POA: Diagnosis not present

## 2022-07-22 DIAGNOSIS — Z79899 Other long term (current) drug therapy: Secondary | ICD-10-CM | POA: Diagnosis not present

## 2022-07-26 ENCOUNTER — Ambulatory Visit: Payer: Medicare Other | Attending: Cardiovascular Disease | Admitting: Pharmacist

## 2022-07-26 DIAGNOSIS — R760 Raised antibody titer: Secondary | ICD-10-CM | POA: Diagnosis not present

## 2022-07-26 DIAGNOSIS — Z7901 Long term (current) use of anticoagulants: Secondary | ICD-10-CM | POA: Diagnosis not present

## 2022-07-26 LAB — POCT INR: INR: 1.6 — AB (ref 2.0–3.0)

## 2022-07-26 NOTE — Patient Instructions (Signed)
Description   NO PRINTOUT NEEDED/USES RECORDER. Instructed pt to take an extra tablet today and an extra 1/2 tablet tomorrow, then continue taking 2 tablets daily except 1 tablet on Monday, Wednesday and Friday.  Recheck INR in 2 weeks    Schedule annual follow up with Dr Antoine Poche

## 2022-07-29 DIAGNOSIS — G4733 Obstructive sleep apnea (adult) (pediatric): Secondary | ICD-10-CM | POA: Diagnosis not present

## 2022-07-30 ENCOUNTER — Telehealth: Payer: Self-pay | Admitting: Cardiology

## 2022-07-30 DIAGNOSIS — D6861 Antiphospholipid syndrome: Secondary | ICD-10-CM

## 2022-07-30 DIAGNOSIS — R760 Raised antibody titer: Secondary | ICD-10-CM

## 2022-07-30 DIAGNOSIS — Z7901 Long term (current) use of anticoagulants: Secondary | ICD-10-CM

## 2022-07-30 MED ORDER — WARFARIN SODIUM 4 MG PO TABS: ORAL_TABLET | ORAL | 1 refills | Status: DC

## 2022-07-30 NOTE — Telephone Encounter (Signed)
Warfarin 4mg  refill  Antiphospholipid antibody syndrome, CVA 2003 Last INR 07/26/22 Last OV 03/03/21 & HAS AN APPT PENDING WITH CALLIE ON 08/12/22

## 2022-07-30 NOTE — Telephone Encounter (Signed)
*  STAT* If patient is at the pharmacy, call can be transferred to refill team.   1. Which medications need to be refilled? (please list name of each medication and dose if known) warfarin (COUMADIN) 4 MG tablet   2. Which pharmacy/location (including street and city if local pharmacy) is medication to be sent to?  [id number] - [place], [place] - [address]    3. Do they need a 30 day or 90 day supply? 90

## 2022-08-07 NOTE — Progress Notes (Deleted)
Cardiology Office Note:    Date:  08/07/2022   ID:  Kara Zamora, DOB [birth year], MRN [id number]  PCP:  Norm Salt, PA  Cardiologist:  Rollene Rotunda, MD  Electrophysiologist:  None   Referring MD: Norm Salt, PA   Chief Complaint: follow-up of antiphospholipid antibody syndrome and hypertension  History of Present Illness:    Kara Zamora is a 42 y.o. 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 Antoine Poche and presents today for routine follow-up of antiphospholipid antibody syndrome and hypertension.   Patient was referred to Dr. Antoine Poche in 08/2018 to get established in our Coumadin clinic for her antiphospholipid antibody syndrome. Prior Echo in 2016 showed LVEF of 65-70% with mild LVH and mild MR. She denied any cardiac symptoms at that initial visit with Dr. Antoine Poche. She has followed in our Coumadin Clinic since that time but has not been seen by Dr. Antoine Poche or an APP since.  Patient was last seen by me in 02/2021 at which time she was doing well from a cardiac standpoint. Lisinopril was increased for additional BP control. Echo was ordered for routine monitor of her mitral valve which showed LVEF of 60-65% with normal wall motion and diastolic parameters, normal RV, mild MR, and mildly elevated PASP.   Patient presents today for follow-up. ***  Antiphospholipid Antibody Syndrome Diagnosed with antiphospholipid antibody syndrome after a stroke in 2003. She has had residual blindness from this and has been on chronic anticoagulation with Coumadin. - No new CVA or thrombotic event. - Continue Coumadin. Followed in our Coumadin Clinic.    Mild Mitral Regurgitation Noted on Echo in 05/2021.  - Can continue routine surveillance as an outpatient. Recommend repeat Echo  in 2026 to 2028.    Hypertension BP well controlled. *** - Continue current medications: Amlodipine-Benazepril 5-20mg  daily and HCTZ 25mg  daily. - BMET ***   Tobacco Abuse She continues to smoke 3-4 cigarettes today. She is already on Wellbutrin. *** - Encouraged patient to continue to work towards complete cessation.   Obstructive Sleep Apnea On CPAP. - Continue.   Morbid Obesity BMI 75. *** - Weight loss will be very important. Discussed importance of increasing physical activity and following a heart healthy diet. Goal is at least 150 minutes of physical activity per week. ***    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HCC)    Asthma    Blind    Hypertension    Stroke [facility])    Tobacco abuse 05/30/2014    Past Surgical History:  Procedure Laterality Date   None      Current Medications: No outpatient medications have been marked as taking for the 08/12/22 encounter (Appointment) with Corrin Parker, PA-C.     Allergies:   Patient has no known allergies.   Social History   Socioeconomic History   Marital status: Married    Spouse name: Not on file   Number of children: Not on file   Years of education: Not on file   Highest education level: Not on file  Occupational History   Not on file  Tobacco Use   Smoking status: Every Day    Packs/day: .5    Types: Cigarettes   Smokeless tobacco: Never   Tobacco comments:    Smokes 2-3 cigs a day.  Vaping Use   Vaping Use: Never used  Substance and Sexual Activity   Alcohol use: Yes    Comment: occ.   Drug use: No   Sexual activity: Not on file  Other Topics Concern   Not on file  Social History Narrative   Lives alone.  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   Social Determinants of Health   Financial Resource Strain: Not on file  Food Insecurity: Not on file  Transportation Needs: Not on file  Physical Activity: Not on file  Stress: Not on file  Social Connections: Not  on file     Family History: The patient's family history is not on file.  ROS:   Please see the history of present illness.     EKGs/Labs/Other Studies Reviewed:    The following studies were reviewed:  Echocardiogram 2/223/2023: Impressions: 1. Left ventricular ejection fraction, by estimation, is 60 to 65%. The  left ventricle has normal function. The left ventricle has no regional  wall motion abnormalities. Left ventricular diastolic parameters were  normal.   2. Right ventricular systolic function is normal. The right ventricular  size is normal. There is mildly elevated pulmonary artery systolic  pressure.   3. The mitral valve is normal in structure. Mild mitral valve  regurgitation. No evidence of mitral stenosis.   4. The aortic valve is normal in structure. Aortic valve regurgitation is  not visualized. No aortic stenosis is present.   5. The inferior vena cava is normal in size with greater than 50%  respiratory variability, suggesting right atrial pressure of 3 mmHg.   Comparison(s): No significant change from prior study. 06/01/14 EF 65-70%.  Mild MR.    EKG:  EKG ordered today. EKG personally reviewed and demonstrates ***.  Recent Labs: 04/26/2022: Hemoglobin 12.7; Platelets 429.0  Recent Lipid Panel    Component Value Date/Time   CHOL 170 05/30/2014 1630   TRIG 96 05/30/2014 1630   HDL 52 05/30/2014 1630   CHOLHDL 3.3 05/30/2014 1630   VLDL 19 05/30/2014 1630   LDLCALC 99 05/30/2014 1630    Physical Exam:    Vital Signs: There were no vitals taken for this visit.    Wt Readings from Last 3 Encounters:  05/27/22 (!) 367 lb 12.8 oz (166.8 kg)  05/18/22 (!) 365 lb (165.6 kg)  04/12/22 (!) 374 lb (169.6 kg)     General: 42 y.o. female in no acute distress. HEENT: Normocephalic and atraumatic. Sclera clear. EOMs intact. Neck: Supple. No carotid bruits. No JVD. Heart: *** RRR. Distinct S1 and S2. No murmurs, gallops, or rubs. Radial and distal pedal  pulses 2+ and equal bilaterally. Lungs: No increased work of breathing. Clear to ausculation bilaterally. No wheezes, rhonchi, or rales.  Abdomen: Soft, non-distended, and non-tender to palpation. Bowel sounds present in all 4 quadrants.  MSK: Normal strength and tone for age. *** Extremities: No lower extremity edema.    Skin: Warm and dry. Neuro: Alert and oriented x3. No focal deficits. Psych: Normal affect. Responds appropriately.   Assessment:    No diagnosis found.  Plan:     Disposition: Follow up in ***   Medication Adjustments/Labs and Tests Ordered: Current medicines are reviewed at length with the patient today.  Concerns regarding medicines are outlined above.  No orders of the defined types were placed in this encounter.  No orders of the defined types were placed in this encounter.   There are no Patient Instructions on file for this visit.  Signed, Corrin Parker, PA-C  08/07/2022 6:05 PM    [facility]

## 2022-08-09 ENCOUNTER — Ambulatory Visit: Payer: Medicare Other

## 2022-08-12 ENCOUNTER — Ambulatory Visit: Payer: Medicare Other

## 2022-08-12 ENCOUNTER — Ambulatory Visit: Payer: Medicare Other | Admitting: Student

## 2022-08-16 ENCOUNTER — Telehealth: Payer: Self-pay | Admitting: *Deleted

## 2022-08-16 ENCOUNTER — Ambulatory Visit: Payer: Medicare Other | Attending: Cardiology

## 2022-08-16 NOTE — Telephone Encounter (Signed)
Called pt since she missed her Anticoagulation Clinic Appt; there was no answer so left a message for her to call back for an appt. Will await & follow up.

## 2022-08-20 DIAGNOSIS — M25562 Pain in left knee: Secondary | ICD-10-CM | POA: Diagnosis not present

## 2022-08-20 DIAGNOSIS — M545 Low back pain, unspecified: Secondary | ICD-10-CM | POA: Diagnosis not present

## 2022-08-20 DIAGNOSIS — F17210 Nicotine dependence, cigarettes, uncomplicated: Secondary | ICD-10-CM | POA: Diagnosis not present

## 2022-08-20 DIAGNOSIS — F17200 Nicotine dependence, unspecified, uncomplicated: Secondary | ICD-10-CM | POA: Diagnosis not present

## 2022-08-20 DIAGNOSIS — G8929 Other chronic pain: Secondary | ICD-10-CM | POA: Diagnosis not present

## 2022-08-20 DIAGNOSIS — Z79899 Other long term (current) drug therapy: Secondary | ICD-10-CM | POA: Diagnosis not present

## 2022-08-20 DIAGNOSIS — M25561 Pain in right knee: Secondary | ICD-10-CM | POA: Diagnosis not present

## 2022-08-20 DIAGNOSIS — H543 Unqualified visual loss, both eyes: Secondary | ICD-10-CM | POA: Diagnosis not present

## 2022-08-24 DIAGNOSIS — Z79899 Other long term (current) drug therapy: Secondary | ICD-10-CM | POA: Diagnosis not present

## 2022-08-26 DIAGNOSIS — L089 Local infection of the skin and subcutaneous tissue, unspecified: Secondary | ICD-10-CM | POA: Diagnosis not present

## 2022-08-27 ENCOUNTER — Other Ambulatory Visit: Payer: Self-pay | Admitting: Physician Assistant

## 2022-08-27 DIAGNOSIS — Z1231 Encounter for screening mammogram for malignant neoplasm of breast: Secondary | ICD-10-CM

## 2022-08-28 DIAGNOSIS — G4733 Obstructive sleep apnea (adult) (pediatric): Secondary | ICD-10-CM | POA: Diagnosis not present

## 2022-09-10 ENCOUNTER — Other Ambulatory Visit: Payer: Self-pay | Admitting: Cardiology

## 2022-09-10 DIAGNOSIS — D6861 Antiphospholipid syndrome: Secondary | ICD-10-CM

## 2022-09-10 DIAGNOSIS — Z7901 Long term (current) use of anticoagulants: Secondary | ICD-10-CM

## 2022-09-10 DIAGNOSIS — R760 Raised antibody titer: Secondary | ICD-10-CM

## 2022-09-10 NOTE — Telephone Encounter (Signed)
Called pt and left another voicemail.  Pt needs an appt with Anticoagulation Clinic for warfarin refill as med is a monitored medication.

## 2022-09-10 NOTE — Telephone Encounter (Addendum)
Warfarin refill Antiphospholipid antibody syndrome  Last INR 07/26/22-NEEDS AN APPT Last OV 03/03/21  WILL NEED TO CALL PT FOR AN APPT SINCE SHE IS OVERDUE.   At 856am, called pt and the mailbox is full. So unable to leave a message.

## 2022-09-10 NOTE — Telephone Encounter (Signed)
Called pt again and was able to leave a voicemail regarding needing an appt. Will await a call back.

## 2022-09-18 NOTE — Progress Notes (Deleted)
Cardiology Office Note:    Date:  09/18/2022   ID:  Kara Zamora, DOB 1980/04/18, MRN [id number]  PCP:  Norm Salt, PA  Cardiologist:  Rollene Rotunda, MD  Electrophysiologist:  None   Referring MD: Norm Salt, [place]   Chief Complaint: routine follow-up of antiphospholipid antibody syndrome and hypertension.  History of Present Illness:    Kara Zamora is a 42 y.o. 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 Antoine Poche and presents today for routine follow-up.   Patient was referred to Dr. Antoine Poche in 08/2018 to get established in our Coumadin clinic for her antiphospholipid antibody syndrome. Prior Echo in 2016 showed LVEF of 65-70% with mild LVH and mild MR. She denied any cardiac symptoms at that initial visit with Dr. Antoine Poche. She has followed in our Coumadin Clinic since that time but has not been seen by Dr. Antoine Poche or an APP since.  Patient was last seen by me in 02/2021 at which time she was doing well from a cardiac standpoint. Lisinopril was increased for additional BP control. Echo was ordered for routine monitor of her mitral valve which showed LVEF of 60-65% with normal wall motion and diastolic parameters, normal RV, mild MR, and mildly elevated PASP.   Patient presents today for follow-up. ***  Antiphospholipid Antibody Syndrome Diagnosed with antiphospholipid antibody syndrome after a stroke in 2003. She has had residual blindness from this and has been on chronic anticoagulation with Coumadin. - No new CVA or thrombotic event. - Continue Coumadin. Followed in our Coumadin Clinic.    Mild Mitral Regurgitation Noted on Echo in 05/2021.  - Can continue routine surveillance as an outpatient. Recommend repeat Echo in 2026 to 2028.    Hypertension BP well  controlled. *** - Continue current medications: Amlodipine-Benazepril 5-20mg  daily and HCTZ 25mg  daily. - BMET ***   Tobacco Abuse She continues to smoke 3-4 cigarettes today. She is already on Wellbutrin. *** - Encouraged patient to continue to work towards complete cessation.   Obstructive Sleep Apnea On CPAP. - Continue.   Morbid Obesity BMI 75. *** - Weight loss will be very important. Discussed importance of increasing physical activity and following a heart healthy diet. Goal is at least 150 minutes of physical activity per week. ***  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HCC)    Asthma    Blind    Hypertension    Stroke [facility])    Tobacco abuse 05/30/2014    Past Surgical History:  Procedure Laterality Date   None      Current Medications: No outpatient medications have been marked as taking for the 09/23/22 encounter (Appointment) with Corrin Parker, PA-C.     Allergies:   Patient has no known allergies.   Social History   Socioeconomic History   Marital status: Married    Spouse name: Not on file   Number of children: Not on file   Years of education: Not on file   Highest education level: Not on file  Occupational History   Not on file  Tobacco Use   Smoking status: Every Day    Packs/day: .5    Types: Cigarettes   Smokeless tobacco: Never   Tobacco comments:    Smokes 2-3 cigs a day.   Vaping Use   Vaping Use:  Never used  Substance and Sexual Activity   Alcohol use: Yes    Comment: occ.   Drug use: No   Sexual activity: Not on file  Other Topics Concern   Not on file  Social History Narrative   Lives alone.  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   Social Determinants of Health   Financial Resource Strain: Not on file  Food Insecurity: Not on file  Transportation Needs: Not on file  Physical Activity: Not on file  Stress: Not on file  Social Connections: Not on file     Family History: The patient's  family history is not on file.  ROS:   Please see the history of present illness.     EKGs/Labs/Other Studies Reviewed:    The following studies were reviewed today:  Echocardiogram 06/01/2014: Study Conclusions: - Left ventricle: The cavity size was normal. Wall thickness was    increased in a pattern of mild LVH. Systolic function was    vigorous. The estimated ejection fraction was in the range of 65%    to 70%. Left ventricular diastolic function parameters were    normal.  - Mitral valve: There was mild regurgitation.   EKG:  EKG  ordered today. EKG personally reviewed and demonstrates ***.  Recent Labs: 04/26/2022: Hemoglobin 12.7; Platelets 429.0  Recent Lipid Panel    Component Value Date/Time   CHOL 170 05/30/2014 1630   TRIG 96 05/30/2014 1630   HDL 52 05/30/2014 1630   CHOLHDL 3.3 05/30/2014 1630   VLDL 19 05/30/2014 1630   LDLCALC 99 05/30/2014 1630    Physical Exam:    Vital Signs: There were no vitals taken for this visit.    Wt Readings from Last 3 Encounters:  05/27/22 (!) 367 lb 12.8 oz (166.8 kg)  05/18/22 (!) 365 lb (165.6 kg)  04/12/22 (!) 374 lb (169.6 kg)     General: 42 y.o. female in no acute distress. HEENT: Normocephalic and atraumatic. Sclera clear. EOMs intact. Neck: Supple. No carotid bruits. No JVD. Heart: *** RRR. Distinct S1 and S2. No murmurs, gallops, or rubs. Radial and distal pedal pulses 2+ and equal bilaterally. Lungs: No increased work of breathing. Clear to ausculation bilaterally. No wheezes, rhonchi, or rales.  Abdomen: Soft, non-distended, and non-tender to palpation. Bowel sounds present in all 4 quadrants.  MSK: Normal strength and tone for age. *** Extremities: No lower extremity edema.    Skin: Warm and dry. Neuro: Alert and oriented x3. No focal deficits. Psych: Normal affect. Responds appropriately.   Assessment:    No diagnosis found.  Plan:     Disposition: Follow up in ***   Medication Adjustments/Labs  and Tests Ordered: Current medicines are reviewed at length with the patient today.  Concerns regarding medicines are outlined above.  No orders of the defined types were placed in this encounter.  No orders of the defined types were placed in this encounter.   There are no Patient Instructions on file for this visit.   Signed, Corrin Parker, PA-C  09/18/2022 8:02 PM    [facility]

## 2022-09-23 ENCOUNTER — Ambulatory Visit: Payer: Medicare Other | Attending: Student | Admitting: Student

## 2022-09-24 DIAGNOSIS — G8929 Other chronic pain: Secondary | ICD-10-CM | POA: Diagnosis not present

## 2022-09-24 DIAGNOSIS — M25561 Pain in right knee: Secondary | ICD-10-CM | POA: Diagnosis not present

## 2022-09-24 DIAGNOSIS — F17210 Nicotine dependence, cigarettes, uncomplicated: Secondary | ICD-10-CM | POA: Diagnosis not present

## 2022-09-24 DIAGNOSIS — F17200 Nicotine dependence, unspecified, uncomplicated: Secondary | ICD-10-CM | POA: Diagnosis not present

## 2022-09-24 DIAGNOSIS — M545 Low back pain, unspecified: Secondary | ICD-10-CM | POA: Diagnosis not present

## 2022-09-24 DIAGNOSIS — M25562 Pain in left knee: Secondary | ICD-10-CM | POA: Diagnosis not present

## 2022-09-24 DIAGNOSIS — Z79899 Other long term (current) drug therapy: Secondary | ICD-10-CM | POA: Diagnosis not present

## 2022-09-28 DIAGNOSIS — G4733 Obstructive sleep apnea (adult) (pediatric): Secondary | ICD-10-CM | POA: Diagnosis not present

## 2022-09-28 DIAGNOSIS — Z79899 Other long term (current) drug therapy: Secondary | ICD-10-CM | POA: Diagnosis not present

## 2022-10-12 ENCOUNTER — Telehealth: Payer: Self-pay

## 2022-10-12 DIAGNOSIS — D6861 Antiphospholipid syndrome: Secondary | ICD-10-CM

## 2022-10-12 DIAGNOSIS — Z7901 Long term (current) use of anticoagulants: Secondary | ICD-10-CM

## 2022-10-12 DIAGNOSIS — R760 Raised antibody titer: Secondary | ICD-10-CM

## 2022-10-12 MED ORDER — WARFARIN SODIUM 4 MG PO TABS
ORAL_TABLET | ORAL | 0 refills | Status: DC
Start: 2022-10-12 — End: 2022-10-19

## 2022-10-12 NOTE — Telephone Encounter (Signed)
Pt called into clinic to schedule appt, pt is overdue for follow-up.  Last seen on 07/26/22, pt has No showed and cancelled her last several appts.  Pt states she needs the last appt of the day, scheduled pt for first available 4:00 appt on 10/19/22.  Pt states she is out of Warfarin, advised pt I could send her in a 1 week supply of Warfarin to get her to this scheduled appt on 10/19/22.  Pt verbalized understanding and is aware must keep scheduled appt for future refills. Will send refill to [facility]  [address] per pt request.

## 2022-10-19 ENCOUNTER — Ambulatory Visit: Payer: Medicare Other | Attending: Cardiology

## 2022-10-19 DIAGNOSIS — Z7901 Long term (current) use of anticoagulants: Secondary | ICD-10-CM | POA: Diagnosis not present

## 2022-10-19 DIAGNOSIS — D6861 Antiphospholipid syndrome: Secondary | ICD-10-CM

## 2022-10-19 DIAGNOSIS — R760 Raised antibody titer: Secondary | ICD-10-CM

## 2022-10-19 LAB — POCT INR: INR: 1.3 — AB (ref 2.0–3.0)

## 2022-10-19 MED ORDER — WARFARIN SODIUM 4 MG PO TABS
ORAL_TABLET | ORAL | 0 refills | Status: DC
Start: 2022-10-19 — End: 2022-11-18

## 2022-10-19 NOTE — Patient Instructions (Signed)
NO PRINTOUT NEEDED/USES RECORDER. Instructed pt to take 3 tablets today and 2 tablets tomorrow, then continue taking 2 tablets daily except 1 tablet on Monday, Wednesday and Friday.  Recheck INR in 2 weeks per pt availability

## 2022-10-28 DIAGNOSIS — G4733 Obstructive sleep apnea (adult) (pediatric): Secondary | ICD-10-CM | POA: Diagnosis not present

## 2022-11-01 ENCOUNTER — Telehealth: Payer: Self-pay

## 2022-11-01 ENCOUNTER — Telehealth: Payer: Self-pay | Admitting: Cardiology

## 2022-11-01 NOTE — Telephone Encounter (Signed)
Pt came to [facility] office and stated she is unable to come to Coumadin Clinic appt tomorrow. Needs to reschedule; however she can not come for 2 more week due to work. INR recommends INR be checked this week or next week due to INR subtherapeutic at last visit. Appt rescheduled for 11/15/22.

## 2022-11-01 NOTE — Telephone Encounter (Signed)
Patient came in 11/01/2022 rather than 11/02/2022. Coumadin was unable to see them. Cammy Copa was able to r/s her for COUMADIN for the 8/13 at 3:30.

## 2022-11-02 ENCOUNTER — Ambulatory Visit: Payer: Medicare Other

## 2022-11-05 ENCOUNTER — Telehealth: Payer: Self-pay | Admitting: Pulmonary Disease

## 2022-11-05 MED ORDER — BREZTRI AEROSPHERE 160-9-4.8 MCG/ACT IN AERO: 2.0000 | INHALATION_SPRAY | Freq: Two times a day (BID) | RESPIRATORY_TRACT | 3 refills | Status: DC

## 2022-11-05 NOTE — Telephone Encounter (Signed)
Breztri refilled. Will need to establish care with new physician

## 2022-11-05 NOTE — Telephone Encounter (Signed)
Pt needs more Breztri. She is all out.  Pharm is [facility] [address]  Her # is [phone]

## 2022-11-11 ENCOUNTER — Other Ambulatory Visit: Payer: Self-pay | Admitting: Cardiology

## 2022-11-11 DIAGNOSIS — D6861 Antiphospholipid syndrome: Secondary | ICD-10-CM

## 2022-11-11 DIAGNOSIS — R760 Raised antibody titer: Secondary | ICD-10-CM

## 2022-11-11 DIAGNOSIS — Z7901 Long term (current) use of anticoagulants: Secondary | ICD-10-CM

## 2022-11-11 NOTE — Telephone Encounter (Signed)
Prescription refill request received for warfarin Lov: 03/03/21 Kara Zamora)  Next INR check: 11/02/22 (overdue)  Warfarin tablet strength: 4mg   Overdue for anticoagulation visit. Pt has scheduled appt on 11/15/22. Will wait to refill after INR check.

## 2022-11-15 ENCOUNTER — Telehealth: Payer: Self-pay

## 2022-11-15 ENCOUNTER — Ambulatory Visit: Payer: Medicare Other | Attending: Cardiology

## 2022-11-15 DIAGNOSIS — J4540 Moderate persistent asthma, uncomplicated: Secondary | ICD-10-CM | POA: Diagnosis not present

## 2022-11-15 DIAGNOSIS — Z8673 Personal history of transient ischemic attack (TIA), and cerebral infarction without residual deficits: Secondary | ICD-10-CM | POA: Diagnosis not present

## 2022-11-15 DIAGNOSIS — D6861 Antiphospholipid syndrome: Secondary | ICD-10-CM | POA: Diagnosis not present

## 2022-11-15 DIAGNOSIS — R3915 Urgency of urination: Secondary | ICD-10-CM | POA: Diagnosis not present

## 2022-11-15 DIAGNOSIS — I10 Essential (primary) hypertension: Secondary | ICD-10-CM | POA: Diagnosis not present

## 2022-11-15 DIAGNOSIS — Z720 Tobacco use: Secondary | ICD-10-CM | POA: Diagnosis not present

## 2022-11-15 NOTE — Telephone Encounter (Signed)
Pt missed coumadin clinic appt. Called, no answer. Left message on voicemail.

## 2022-11-16 ENCOUNTER — Ambulatory Visit
Admission: EM | Admit: 2022-11-16 | Discharge: 2022-11-16 | Disposition: A | Discharge status: Home / Self Care | Payer: Medicare Other | Attending: Internal Medicine | Admitting: Internal Medicine

## 2022-11-16 DIAGNOSIS — L249 Irritant contact dermatitis, unspecified cause: Secondary | ICD-10-CM | POA: Diagnosis not present

## 2022-11-16 MED ORDER — MUPIROCIN 2 % EX OINT: 1.0000 | TOPICAL_OINTMENT | Freq: Three times a day (TID) | CUTANEOUS | 0 refills | Status: DC

## 2022-11-16 MED ORDER — TRIAMCINOLONE ACETONIDE 0.1 % EX CREA: 1.0000 | TOPICAL_CREAM | Freq: Two times a day (BID) | CUTANEOUS | 0 refills | Status: DC

## 2022-11-16 NOTE — ED Provider Notes (Signed)
[facility]  Note:  This document was prepared using Conservation officer, historic buildings and may include unintentional dictation errors.  MRN: [id number] DOB: 12-Oct-1980  Subjective:   Kara Zamora is a 42 y.o. female presenting for 9 month history of persistent facial skin irritation, itching. Has seen her PCP and dermatologist. Has been told she has general skin irritation. She has used hydrocortisone cream otc in the past, is currently taking doxycycline twice daily. Believes that this irritation is related to using her CPAP.   No current facility-administered medications for this encounter.  Current Outpatient Medications:    albuterol (VENTOLIN HFA) 108 (90 Base) MCG/ACT inhaler, Inhale 2 puffs into the lungs every 6 (six) hours as needed for wheezing or shortness of breath (Cough)., Disp: 18 g, Rfl: 0   amLODipine-benazepril (LOTREL) 5-20 MG capsule, Take 1 capsule by mouth daily., Disp: , Rfl:    Budeson-Glycopyrrol-Formoterol (BREZTRI AEROSPHERE) 160-9-4.8 MCG/ACT AERO, Inhale 2 puffs into the lungs in the morning and at bedtime., Disp: 10.7 g, Rfl: 3   buPROPion (WELLBUTRIN SR) 150 MG 12 hr tablet, Take 1 tablet by mouth 2 (two) times daily., Disp: , Rfl:    hydrochlorothiazide (HYDRODIURIL) 25 MG tablet, Take 1 tablet (25 mg total) by mouth daily., Disp: 14 tablet, Rfl: 0   miconazole (MICOTIN) 2 % cream, Apply 1 application. topically 2 (two) times daily. Apply to affected areas twice a day, Disp: 28 g, Rfl: 0   montelukast (SINGULAIR) 10 MG tablet, Take 10 mg by mouth daily., Disp: , Rfl:    nystatin (MYCOSTATIN/NYSTOP) powder, Apply 1 application. topically 3 (three) times daily. Apply to affected areas three times a day, Disp: 30 g, Rfl: 0   valACYclovir (VALTREX) 500 MG tablet, Take 500 mg by mouth daily., Disp: , Rfl:    warfarin (COUMADIN) 4 MG tablet, TAKE 1 TO 2 TABLETS BY MOUTH DAILY OR AS PRESCRIBED BY COUMADIN CLINIC, Disp: 50 tablet, Rfl: 0    No Known Allergies  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HCC)    Asthma    Blind    Hypertension    Stroke [facility])    Tobacco abuse 05/30/2014     Past Surgical History:  Procedure Laterality Date   None      No family history on file.  Social History   Tobacco Use   Smoking status: Every Day    Current packs/day: 0.50    Types: Cigarettes   Smokeless tobacco: Never   Tobacco comments:    Smokes 2-3 cigs a day.   Vaping Use   Vaping status: Never Used  Substance Use Topics   Alcohol use: Yes    Comment: occ.   Drug use: No    ROS   Objective:   Vitals: BP 137/79 (BP Location: Right Arm)   Pulse 89   Temp 98.6 F (37 C) (Oral)   Resp 20   LMP 11/03/2022 (Approximate)   SpO2 96%   Physical Exam Constitutional:      General: She is not in acute distress.    Appearance: Normal appearance. She is well-developed. She is not ill-appearing, toxic-appearing or diaphoretic.  HENT:     Head: Normocephalic and atraumatic.      Nose: Nose normal.     Mouth/Throat:     Mouth: Mucous membranes are moist.  Eyes:     General: No scleral icterus.       Right eye: No discharge.  Left eye: No discharge.     Extraocular Movements: Extraocular movements intact.  Cardiovascular:     Rate and Rhythm: Normal rate.  Pulmonary:     Effort: Pulmonary effort is normal.  Skin:    General: Skin is warm and dry.  Neurological:     General: No focal deficit present.     Mental Status: She is alert and oriented to person, place, and time.  Psychiatric:        Mood and Affect: Mood normal.        Behavior: Behavior normal.     Assessment and Plan :   PDMP not reviewed this encounter.  1. Irritant contact dermatitis, unspecified trigger    Recommended mupirocin for antibiotic coverage, will use light thin layers of triamcinolone cream. Discussed appropriate use of topical treatments. Follow up with dermatologist as soon  as possible. Counseled patient on potential for adverse effects with medications prescribed/recommended today, ER and return-to-clinic precautions discussed, patient verbalized understanding.    Wallis Bamberg, [place] 11/16/22 1657

## 2022-11-16 NOTE — Discharge Instructions (Signed)
Mupirocin is an antibiotic ointment I want you to use for 1 week. Apply 3 times daily.   Triamcinolone is a steroid cream that you can use twice daily for 1 week. Apply very thin layers. After 1 week, you need to take a break from using the steroid cream for an entire week before resuming as needed.   Make sure you follow up with your dermatologist.

## 2022-11-16 NOTE — ED Triage Notes (Signed)
Pt c/o skin irritation and itching to face x 9 months-seen by PCP, dermatologist for same with no improvement-NAD-slow gait with own cane/pt is blind

## 2022-11-18 ENCOUNTER — Ambulatory Visit: Payer: Medicare Other | Attending: Cardiology | Admitting: *Deleted

## 2022-11-18 DIAGNOSIS — D6861 Antiphospholipid syndrome: Secondary | ICD-10-CM

## 2022-11-18 DIAGNOSIS — R760 Raised antibody titer: Secondary | ICD-10-CM | POA: Diagnosis not present

## 2022-11-18 DIAGNOSIS — Z7901 Long term (current) use of anticoagulants: Secondary | ICD-10-CM | POA: Diagnosis not present

## 2022-11-18 LAB — POCT INR: INR: 2.4 (ref 2.0–3.0)

## 2022-11-18 MED ORDER — WARFARIN SODIUM 4 MG PO TABS
ORAL_TABLET | ORAL | 1 refills | Status: AC
Start: 2022-11-18 — End: ?

## 2022-11-18 NOTE — Patient Instructions (Addendum)
Description   NO PRINTOUT NEEDED/USES RECORDER. Instructed pt to continue taking 2 tablets daily except 1 tablet on Monday, Wednesday and Friday.  Recheck INR in 4 weeks.

## 2022-11-28 DIAGNOSIS — G4733 Obstructive sleep apnea (adult) (pediatric): Secondary | ICD-10-CM | POA: Diagnosis not present

## 2022-12-01 DIAGNOSIS — L710 Perioral dermatitis: Secondary | ICD-10-CM | POA: Diagnosis not present

## 2022-12-07 DIAGNOSIS — Z8673 Personal history of transient ischemic attack (TIA), and cerebral infarction without residual deficits: Secondary | ICD-10-CM | POA: Diagnosis not present

## 2022-12-07 DIAGNOSIS — R3915 Urgency of urination: Secondary | ICD-10-CM | POA: Diagnosis not present

## 2022-12-07 DIAGNOSIS — D6861 Antiphospholipid syndrome: Secondary | ICD-10-CM | POA: Diagnosis not present

## 2022-12-07 DIAGNOSIS — I10 Essential (primary) hypertension: Secondary | ICD-10-CM | POA: Diagnosis not present

## 2022-12-07 DIAGNOSIS — Z720 Tobacco use: Secondary | ICD-10-CM | POA: Diagnosis not present

## 2022-12-07 DIAGNOSIS — J4540 Moderate persistent asthma, uncomplicated: Secondary | ICD-10-CM | POA: Diagnosis not present

## 2022-12-07 DIAGNOSIS — R110 Nausea: Secondary | ICD-10-CM | POA: Diagnosis not present

## 2022-12-14 ENCOUNTER — Telehealth: Payer: Self-pay

## 2022-12-14 ENCOUNTER — Ambulatory Visit: Payer: Medicare Other | Attending: Cardiology

## 2022-12-14 NOTE — Telephone Encounter (Signed)
Lpmtcb to reschedule INR appt

## 2022-12-15 ENCOUNTER — Encounter (HOSPITAL_COMMUNITY): Payer: Self-pay | Admitting: Emergency Medicine

## 2022-12-15 ENCOUNTER — Emergency Department (HOSPITAL_COMMUNITY): Payer: Medicare Other

## 2022-12-15 ENCOUNTER — Other Ambulatory Visit: Payer: Self-pay

## 2022-12-15 ENCOUNTER — Emergency Department (HOSPITAL_COMMUNITY)
Admission: EM | Admit: 2022-12-15 | Discharge: 2022-12-15 | Disposition: A | Discharge status: Home / Self Care | Payer: Medicare Other | Attending: Emergency Medicine | Admitting: Emergency Medicine

## 2022-12-15 DIAGNOSIS — J45909 Unspecified asthma, uncomplicated: Secondary | ICD-10-CM | POA: Diagnosis not present

## 2022-12-15 DIAGNOSIS — M545 Low back pain, unspecified: Secondary | ICD-10-CM | POA: Diagnosis not present

## 2022-12-15 DIAGNOSIS — R519 Headache, unspecified: Secondary | ICD-10-CM | POA: Diagnosis not present

## 2022-12-15 DIAGNOSIS — Z041 Encounter for examination and observation following transport accident: Secondary | ICD-10-CM | POA: Diagnosis not present

## 2022-12-15 DIAGNOSIS — Y92410 Unspecified street and highway as the place of occurrence of the external cause: Secondary | ICD-10-CM | POA: Diagnosis not present

## 2022-12-15 DIAGNOSIS — S161XXA Strain of muscle, fascia and tendon at neck level, initial encounter: Secondary | ICD-10-CM | POA: Insufficient documentation

## 2022-12-15 DIAGNOSIS — M47812 Spondylosis without myelopathy or radiculopathy, cervical region: Secondary | ICD-10-CM | POA: Diagnosis not present

## 2022-12-15 DIAGNOSIS — S39012A Strain of muscle, fascia and tendon of lower back, initial encounter: Secondary | ICD-10-CM | POA: Diagnosis not present

## 2022-12-15 DIAGNOSIS — Z7901 Long term (current) use of anticoagulants: Secondary | ICD-10-CM | POA: Diagnosis not present

## 2022-12-15 DIAGNOSIS — M542 Cervicalgia: Secondary | ICD-10-CM | POA: Diagnosis present

## 2022-12-15 DIAGNOSIS — Z8673 Personal history of transient ischemic attack (TIA), and cerebral infarction without residual deficits: Secondary | ICD-10-CM | POA: Insufficient documentation

## 2022-12-15 DIAGNOSIS — S199XXA Unspecified injury of neck, initial encounter: Secondary | ICD-10-CM | POA: Diagnosis not present

## 2022-12-15 DIAGNOSIS — M5030 Other cervical disc degeneration, unspecified cervical region: Secondary | ICD-10-CM | POA: Diagnosis not present

## 2022-12-15 DIAGNOSIS — S0990XA Unspecified injury of head, initial encounter: Secondary | ICD-10-CM | POA: Diagnosis not present

## 2022-12-15 LAB — PROTIME-INR
INR: 2.9 — ABNORMAL HIGH (ref 0.8–1.2)
Prothrombin Time: 30.2 s — ABNORMAL HIGH (ref 11.4–15.2)

## 2022-12-15 MED ORDER — CYCLOBENZAPRINE HCL 10 MG PO TABS: 10.0000 mg | ORAL_TABLET | Freq: Two times a day (BID) | ORAL | 0 refills | Status: DC | PRN
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
## 2022-12-15 NOTE — ED Notes (Signed)
Patient refused final set of vitals.
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
## 2022-12-20 DIAGNOSIS — Z8673 Personal history of transient ischemic attack (TIA), and cerebral infarction without residual deficits: Secondary | ICD-10-CM | POA: Diagnosis not present

## 2022-12-20 DIAGNOSIS — M545 Low back pain, unspecified: Secondary | ICD-10-CM | POA: Diagnosis not present

## 2022-12-20 DIAGNOSIS — I10 Essential (primary) hypertension: Secondary | ICD-10-CM | POA: Diagnosis not present

## 2022-12-20 DIAGNOSIS — Z720 Tobacco use: Secondary | ICD-10-CM | POA: Diagnosis not present

## 2022-12-20 DIAGNOSIS — J4540 Moderate persistent asthma, uncomplicated: Secondary | ICD-10-CM | POA: Diagnosis not present

## 2022-12-20 DIAGNOSIS — D6861 Antiphospholipid syndrome: Secondary | ICD-10-CM | POA: Diagnosis not present

## 2022-12-31 ENCOUNTER — Other Ambulatory Visit: Payer: Self-pay | Admitting: Cardiology

## 2022-12-31 ENCOUNTER — Encounter: Payer: Self-pay | Admitting: Pharmacist

## 2022-12-31 DIAGNOSIS — D6861 Antiphospholipid syndrome: Secondary | ICD-10-CM

## 2022-12-31 DIAGNOSIS — R760 Raised antibody titer: Secondary | ICD-10-CM

## 2022-12-31 DIAGNOSIS — Z7901 Long term (current) use of anticoagulants: Secondary | ICD-10-CM

## 2022-12-31 NOTE — Telephone Encounter (Signed)
Prescription refill request received for warfarin Lov: Irene Limbo, 03/03/2021 Next INR check: 9/10- pt overdue Warfarin tablet strength: 4mg    Pt is overdue for an office visit suppose to see Dr. Antoine Poche on 02/02/2023.   Pt overdue for INR check.

## 2022-12-31 NOTE — Telephone Encounter (Signed)
Called and spoke to pt. Her INR was checked in the ER on 12/15/2022 and was 2.9.   Rescheduled pt to come in on 01/14/2023.  One month supply of warfarin sent in.

## 2023-01-04 DIAGNOSIS — H47099 Other disorders of optic nerve, not elsewhere classified, unspecified eye: Secondary | ICD-10-CM | POA: Diagnosis not present

## 2023-01-04 DIAGNOSIS — D6861 Antiphospholipid syndrome: Secondary | ICD-10-CM | POA: Diagnosis not present

## 2023-01-07 DIAGNOSIS — M1990 Unspecified osteoarthritis, unspecified site: Secondary | ICD-10-CM | POA: Diagnosis not present

## 2023-01-07 DIAGNOSIS — M6281 Muscle weakness (generalized): Secondary | ICD-10-CM | POA: Diagnosis not present

## 2023-01-07 DIAGNOSIS — M5459 Other low back pain: Secondary | ICD-10-CM | POA: Diagnosis not present

## 2023-01-07 DIAGNOSIS — M799 Soft tissue disorder, unspecified: Secondary | ICD-10-CM | POA: Diagnosis not present

## 2023-01-07 DIAGNOSIS — M2569 Stiffness of other specified joint, not elsewhere classified: Secondary | ICD-10-CM | POA: Diagnosis not present

## 2023-01-10 ENCOUNTER — Encounter: Payer: Self-pay | Admitting: Primary Care

## 2023-01-14 ENCOUNTER — Telehealth: Payer: Self-pay

## 2023-01-14 ENCOUNTER — Inpatient Hospital Stay: Payer: Medicare Other

## 2023-01-14 ENCOUNTER — Ambulatory Visit: Payer: Medicare Other | Attending: Cardiology

## 2023-01-14 NOTE — Telephone Encounter (Signed)
Rescheduled appointments per patients request. Talked with the patients spouse who requested the requested the reschedule. Kara Zamora is aware of the changes made to the patients upcoming appointments.

## 2023-01-18 DIAGNOSIS — M5459 Other low back pain: Secondary | ICD-10-CM | POA: Diagnosis not present

## 2023-01-18 DIAGNOSIS — M799 Soft tissue disorder, unspecified: Secondary | ICD-10-CM | POA: Diagnosis not present

## 2023-01-18 DIAGNOSIS — M6281 Muscle weakness (generalized): Secondary | ICD-10-CM | POA: Diagnosis not present

## 2023-01-18 DIAGNOSIS — M1990 Unspecified osteoarthritis, unspecified site: Secondary | ICD-10-CM | POA: Diagnosis not present

## 2023-01-18 DIAGNOSIS — M2569 Stiffness of other specified joint, not elsewhere classified: Secondary | ICD-10-CM | POA: Diagnosis not present

## 2023-01-19 ENCOUNTER — Ambulatory Visit: Payer: Medicare Other | Admitting: Primary Care

## 2023-01-20 DIAGNOSIS — J45901 Unspecified asthma with (acute) exacerbation: Secondary | ICD-10-CM | POA: Diagnosis not present

## 2023-01-28 ENCOUNTER — Telehealth: Payer: Self-pay

## 2023-01-28 DIAGNOSIS — M1990 Unspecified osteoarthritis, unspecified site: Secondary | ICD-10-CM | POA: Diagnosis not present

## 2023-01-28 DIAGNOSIS — M799 Soft tissue disorder, unspecified: Secondary | ICD-10-CM | POA: Diagnosis not present

## 2023-01-28 DIAGNOSIS — M6281 Muscle weakness (generalized): Secondary | ICD-10-CM | POA: Diagnosis not present

## 2023-01-28 DIAGNOSIS — M5459 Other low back pain: Secondary | ICD-10-CM | POA: Diagnosis not present

## 2023-01-28 DIAGNOSIS — M2569 Stiffness of other specified joint, not elsewhere classified: Secondary | ICD-10-CM | POA: Diagnosis not present

## 2023-01-28 NOTE — Telephone Encounter (Signed)
Per staff message sent on 10/25 Patient is aware of rescheduled appointment times due to provider being in a meeting at original scheduled timeframe on 02/04/2023

## 2023-01-30 NOTE — Progress Notes (Deleted)
Cardiology Office Note:   Date:  01/30/2023  ID:  Kara Zamora, DOB 09/05/1980, MRN [id number] PCP: Norm Salt, PA  [facility] Providers Cardiologist:  Rollene Rotunda, MD {  History of Present Illness:   Kara Zamora is a 42 y.o. 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  Prior Echo in 2016 showed LVEF of 65-70% with mild LVH and mild MR. ***  ***  Patient was referred to Dr. Antoine Poche in 08/2018 to get established in our Coumadin clinic for her antiphospholipid antibody syndrome. he denied any cardiac symptoms at that initial visit with Dr. Antoine Poche. She has followed in our Coumadin Clinic since that time but has not been seen by Dr. Antoine Poche or an APP since.   Patient presents today for follow-up. Here alone. Patient reports doing well since last visit. She denies any chest pain or shortness of breath. She notes occasional difficult breathing at night which she attributes to her asthma and improves with her inhaler. Does not sound like true orthopnea or PND. She does have sleep apnea and uses a CPAP machine. She denies any significant lower extremity edema. No palpitations, lightheadedness, or dizziness.   Her BP is elevated in the office today at 160/80. She has not taken her BP medication this morning but states systolic BP usually in the 140s at home. She continues to smoke and reports smoking 2-3 cigarettes/day. It sounds like it is mostly comfort thing at this point as she normally does not finish these cigarettes and she just likes having it in her hand especially if she is more stressed/anxious.   ROS: ***  Studies Reviewed:    EKG:       ***  Risk Assessment/Calculations:   {Does this patient have ATRIAL FIBRILLATION?[phone]} No BP recorded.  {Refresh Note OR Click here to enter BP  :1}***         Physical Exam:   VS:  There were no vitals taken for this visit.   Wt Readings from Last 3 Encounters:  12/15/22 (!) 367 lb 11.6 oz (166.8 kg)  05/27/22 (!) 367 lb 12.8 oz (166.8 kg)  05/18/22 (!) 365 lb (165.6 kg)     GEN: Well nourished, well developed in no acute distress NECK: No JVD; No carotid bruits CARDIAC: ***RRR, no murmurs, rubs, gallops RESPIRATORY:  Clear to auscultation without rales, wheezing or rhonchi  ABDOMEN: Soft, non-tender, non-distended EXTREMITIES:  No edema; No deformity   ASSESSMENT AND PLAN:   Antiphospholipid Antibody Syndrome:  *** Diagnosed with antiphospholipid antibody syndrome after a stroke in 2003. She has had residual blindness from this and has been on chronic anticoagulation with Coumadin. - No new CVA or thrombotic event. - Continue Coumadin. Followed in our Coumadin Clinic.    Mild Mitral Regurgitation:  ***   Noted on Echo in 2016.  - Will repeat Echo to reassess MR.   Hypertension:  ***   BP elevated at 160/80 today; however, she has not taken her medications today. Systolic BP normally in the 140s at home. - Will increase Lisinopril to 20mg  daily. - Will repeat BMET today and again in 1-2 weeks after increasing Lisinopril. - Advised patient to let us know if BP stays consistently above 130/80 after increasing Lisinopril.   Tobacco Abuse:  ***  She continues to smoke 3-4 cigarettes today. She is already on Wellbutrin. - Encouraged  patient to continue to work towards complete cessation.   Obstructive Sleep Apnea:  ***  On CPAP. - Continue.   Morbid Obesity:   *** BMI 75. - Weight loss will be very important. Discussed importance of increasing physical activity and following a heart healthy diet. Goal is at least 150 minutes of physical activity per week.    {Are you ordering a CV Procedure (e.g. stress test, cath, DCCV, TEE, etc)?   Press F2        :[id number]}  Follow up ***  Signed, Rollene Rotunda, MD

## 2023-02-01 DIAGNOSIS — M5459 Other low back pain: Secondary | ICD-10-CM | POA: Diagnosis not present

## 2023-02-01 DIAGNOSIS — M2569 Stiffness of other specified joint, not elsewhere classified: Secondary | ICD-10-CM | POA: Diagnosis not present

## 2023-02-01 DIAGNOSIS — M799 Soft tissue disorder, unspecified: Secondary | ICD-10-CM | POA: Diagnosis not present

## 2023-02-01 DIAGNOSIS — M6281 Muscle weakness (generalized): Secondary | ICD-10-CM | POA: Diagnosis not present

## 2023-02-01 DIAGNOSIS — M1990 Unspecified osteoarthritis, unspecified site: Secondary | ICD-10-CM | POA: Diagnosis not present

## 2023-02-02 ENCOUNTER — Ambulatory Visit: Payer: Medicare Other | Attending: Cardiology | Admitting: Cardiology

## 2023-02-02 DIAGNOSIS — Z720 Tobacco use: Secondary | ICD-10-CM

## 2023-02-02 DIAGNOSIS — I340 Nonrheumatic mitral (valve) insufficiency: Secondary | ICD-10-CM

## 2023-02-02 DIAGNOSIS — R760 Raised antibody titer: Secondary | ICD-10-CM

## 2023-02-02 DIAGNOSIS — I10 Essential (primary) hypertension: Secondary | ICD-10-CM

## 2023-02-02 DIAGNOSIS — G4733 Obstructive sleep apnea (adult) (pediatric): Secondary | ICD-10-CM

## 2023-02-03 NOTE — Progress Notes (Deleted)
[facility] CONSULT NOTE  Patient Care Team: Norm Salt, [place] as PCP - General (Physician Assistant) Rollene Rotunda, MD as PCP - Cardiology (Cardiology)  ASSESSMENT & PLAN:  Kara Zamora is a 42 y.o.female with history of *** being seen at [facility] for ***.  No problem-specific Assessment & Plan notes found for this encounter.   No orders of the defined types were placed in this encounter.   The total time spent in the appointment was {CHL ONC TIME VISIT - UJWJX:[id number]} encounter with patients including review of chart and various tests results, discussions about plan of care and coordination of care plan   All questions were answered. The patient knows to call the clinic with any problems, questions or concerns. No barriers to learning was detected.  Melven Sartorius, MD 10/31/20244:21 PM  CHIEF COMPLAINTS/PURPOSE OF CONSULTATION:  ***  HISTORY OF PRESENTING ILLNESS:  Kara Zamora 42 y.o. female is here because of antiphospholipid syndrome.  Available outside records reviewed.  From 09/2017, outside testing showed negative for beta-2 glycoprotein IgG, IgA, IgM, cardiolipin antibody IgG and IgM, lupus anticoagulant.  Report borderline low level protein C and protein S.  Factor V Leiden mutation and prothrombin gene mutation was not identified.  Antithrombin III activity was normal.  Ultrasound from 2016 showed negative for DVT below: No acute deep vein thrombosis was identified within the left lower extremity, however the anterior tibial vein and peroneal veins were not well visualized. Exam End: 01/16/15 13:54   10/2017 [place] RLE IMPRESSION:  No evidence of deep venous thrombosis.   MEDICAL HISTORY:  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HCC)    Asthma    Blind    Hypertension    Stroke [facility])    Tobacco abuse 05/30/2014    SURGICAL HISTORY: Past Surgical History:  Procedure Laterality Date   None       SOCIAL HISTORY: Social History   Socioeconomic History   Marital status: Married    Spouse name: Not on file   Number of children: Not on file   Years of education: Not on file   Highest education level: Not on file  Occupational History   Not on file  Tobacco Use   Smoking status: Every Day    Current packs/day: 0.50    Types: Cigarettes   Smokeless tobacco: Never   Tobacco comments:    Smokes 2-3 cigs a day.   Vaping Use   Vaping status: Never Used  Substance and Sexual Activity   Alcohol use: Yes    Comment: occ.   Drug use: No   Sexual activity: Yes    Birth control/protection: None  Other Topics Concern   Not on file  Social History Narrative   Lives alone.  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   Social Determinants of Health   Financial Resource Strain: Low Risk  (07/03/2020)   Received from [facility], [facility]   Overall Financial Resource Strain (CARDIA)    Difficulty of Paying Living Expenses: Not hard at all  Food Insecurity: No Food Insecurity (07/03/2020)   Received from [facility], [facility]   Hunger Vital Sign    Worried About Running Out of Food in the Last Year: Never true    Ran Out of Food in the Last Year: Never true  Transportation Needs: No Transportation Needs (07/03/2020)   Received from [facility], [facility]   [facility] - Transportation    Lack of Transportation (  Medical): No    Lack of Transportation (Non-Medical): No  Physical Activity: Sufficiently Active (07/03/2020)   Received from [facility], [facility]   Exercise Vital Sign    Days of Exercise per Week: 7 days    Minutes of Exercise per Session: 60 min  Stress: Stress Concern Present (07/03/2020)   Received from [facility], [facility] - Occupational Stress Questionnaire    Feeling of Stress : Rather much  Social Connections: Unknown (08/05/2021)   Received from [facility], [facility]   Social Network     Social Network: Not on file  Intimate Partner Violence: Unknown (07/09/2021)   Received from [facility], [facility]   HITS    Physically Hurt: Not on file    Insult or Talk Down To: Not on file    Threaten Physical Harm: Not on file    Scream or Curse: Not on file    FAMILY HISTORY: No family history on file.  ALLERGIES:  has No Known Allergies.  MEDICATIONS:  Current Outpatient Medications  Medication Sig Dispense Refill   albuterol (VENTOLIN HFA) 108 (90 Base) MCG/ACT inhaler Inhale 2 puffs into the lungs every 6 (six) hours as needed for wheezing or shortness of breath (Cough). 18 g 0   amLODipine-benazepril (LOTREL) 5-20 MG capsule Take 1 capsule by mouth daily.     Budeson-Glycopyrrol-Formoterol (BREZTRI AEROSPHERE) 160-9-4.8 MCG/ACT AERO Inhale 2 puffs into the lungs in the morning and at bedtime. 10.7 g 3   buPROPion (WELLBUTRIN SR) 150 MG 12 hr tablet Take 1 tablet by mouth 2 (two) times daily.     cyclobenzaprine (FLEXERIL) 10 MG tablet Take 1 tablet (10 mg total) by mouth 2 (two) times daily as needed for muscle spasms. 14 tablet 0   hydrochlorothiazide (HYDRODIURIL) 25 MG tablet Take 1 tablet (25 mg total) by mouth daily. 14 tablet 0   miconazole (MICOTIN) 2 % cream Apply 1 application. topically 2 (two) times daily. Apply to affected areas twice a day 28 g 0   montelukast (SINGULAIR) 10 MG tablet Take 10 mg by mouth daily.     mupirocin ointment (BACTROBAN) 2 % Apply 1 Application topically 3 (three) times daily. 30 g 0   nystatin (MYCOSTATIN/NYSTOP) powder Apply 1 application. topically 3 (three) times daily. Apply to affected areas three times a day 30 g 0   triamcinolone cream (KENALOG) 0.1 % Apply 1 Application topically 2 (two) times daily. 45 g 0   valACYclovir (VALTREX) 500 MG tablet Take 500 mg by mouth daily.     warfarin (COUMADIN) 4 MG tablet TAKE 1 TO 2 TABLETS BY MOUTH DAILY OR AS PRESCRIBED BY COUMADIN CLINIC 50 tablet 0   No current facility-administered  medications for this visit.    REVIEW OF SYSTEMS:   Constitutional: Denies fevers, weight loss or abnormal night sweats Eyes: Denies blurriness of vision, double vision Mouth: Denies mucositis or sore throat Respiratory: Denies cough, shortness of breath or wheezes Cardiovascular: Denies palpitation, chest discomfort or lower extremity swelling Gastrointestinal:  Denies nausea, vomiting, abdominal pain, diarrhea, constipation or change in bowel habits GU: Denies any dysuria, hematuria, hesitancy Skin: Denies abnormal skin rashes Lymphatics: Denies new lymphadenopathy or easy bruising Neurological: Denies numbness, tingling or new weaknesses Behavioral/Psych: Mood is stable, no new changes  All other systems were reviewed with the patient and are negative.  PHYSICAL EXAMINATION: ECOG PERFORMANCE STATUS: {CHL ONC ECOG PS:[id number]}  There were no vitals filed for this  visit. There were no vitals filed for this visit.  GENERAL: alert, no distress and comfortable SKIN: skin color is normal, no jaundice, rashes or significant lesions EYES: sclera clear OROPHARYNX: no exudate, no erythema NECK: supple LYMPH:  no palpable lymphadenopathy in the cervical, axillary regions LUNGS: Effort normal, no respiratory distress.  Clear to auscultation bilaterally HEART: regular rate & rhythm and no lower extremity edema ABDOMEN: soft, non-tender and nondistended Musculoskeletal: no point tenderness NEURO: no focal motor/sensory deficits  LABORATORY DATA:  I have reviewed the data as listed Lab Results  Component Value Date   WBC 8.7 04/26/2022   HGB 12.7 04/26/2022   HCT 39.1 04/26/2022   MCV 87.9 04/26/2022   PLT 429.0 (H) 04/26/2022   No results for input(s): "NA", "K", "CL", "CO2", "GLUCOSE", "BUN", "CREATININE", "CALCIUM", "GFRNONAA", "GFRAA", "PROT", "ALBUMIN", "AST", "ALT", "ALKPHOS", "BILITOT", "BILIDIR", "IBILI" in the last 8760 hours.  RADIOGRAPHIC STUDIES: I have personally  reviewed the radiological images as listed and agreed with the findings in the report. No results found.

## 2023-02-04 ENCOUNTER — Other Ambulatory Visit: Payer: Medicare Other

## 2023-02-04 ENCOUNTER — Telehealth: Payer: Self-pay | Admitting: Hematology and Oncology

## 2023-02-04 ENCOUNTER — Inpatient Hospital Stay: Payer: Medicare Other

## 2023-02-04 NOTE — Telephone Encounter (Signed)
Rescheduled appointment per patients request via incoming call. Patient is aware of the changes made to her upcoming appointments.

## 2023-02-08 DIAGNOSIS — M6281 Muscle weakness (generalized): Secondary | ICD-10-CM | POA: Diagnosis not present

## 2023-02-08 DIAGNOSIS — M1990 Unspecified osteoarthritis, unspecified site: Secondary | ICD-10-CM | POA: Diagnosis not present

## 2023-02-08 DIAGNOSIS — M5459 Other low back pain: Secondary | ICD-10-CM | POA: Diagnosis not present

## 2023-02-08 DIAGNOSIS — M799 Soft tissue disorder, unspecified: Secondary | ICD-10-CM | POA: Diagnosis not present

## 2023-02-08 DIAGNOSIS — M2569 Stiffness of other specified joint, not elsewhere classified: Secondary | ICD-10-CM | POA: Diagnosis not present

## 2023-02-10 DIAGNOSIS — M799 Soft tissue disorder, unspecified: Secondary | ICD-10-CM | POA: Diagnosis not present

## 2023-02-10 DIAGNOSIS — M5459 Other low back pain: Secondary | ICD-10-CM | POA: Diagnosis not present

## 2023-02-10 DIAGNOSIS — M1990 Unspecified osteoarthritis, unspecified site: Secondary | ICD-10-CM | POA: Diagnosis not present

## 2023-02-10 DIAGNOSIS — M6281 Muscle weakness (generalized): Secondary | ICD-10-CM | POA: Diagnosis not present

## 2023-02-10 DIAGNOSIS — M2569 Stiffness of other specified joint, not elsewhere classified: Secondary | ICD-10-CM | POA: Diagnosis not present

## 2023-02-11 ENCOUNTER — Other Ambulatory Visit: Payer: Self-pay | Admitting: Cardiology

## 2023-02-11 DIAGNOSIS — Z720 Tobacco use: Secondary | ICD-10-CM | POA: Diagnosis not present

## 2023-02-11 DIAGNOSIS — D6861 Antiphospholipid syndrome: Secondary | ICD-10-CM | POA: Diagnosis not present

## 2023-02-11 DIAGNOSIS — R760 Raised antibody titer: Secondary | ICD-10-CM

## 2023-02-11 DIAGNOSIS — Z8673 Personal history of transient ischemic attack (TIA), and cerebral infarction without residual deficits: Secondary | ICD-10-CM | POA: Diagnosis not present

## 2023-02-11 DIAGNOSIS — M545 Low back pain, unspecified: Secondary | ICD-10-CM | POA: Diagnosis not present

## 2023-02-11 DIAGNOSIS — S39012D Strain of muscle, fascia and tendon of lower back, subsequent encounter: Secondary | ICD-10-CM | POA: Diagnosis not present

## 2023-02-11 DIAGNOSIS — G8929 Other chronic pain: Secondary | ICD-10-CM | POA: Diagnosis not present

## 2023-02-11 DIAGNOSIS — J4540 Moderate persistent asthma, uncomplicated: Secondary | ICD-10-CM | POA: Diagnosis not present

## 2023-02-11 DIAGNOSIS — Z7901 Long term (current) use of anticoagulants: Secondary | ICD-10-CM

## 2023-02-11 DIAGNOSIS — Z131 Encounter for screening for diabetes mellitus: Secondary | ICD-10-CM | POA: Diagnosis not present

## 2023-02-11 DIAGNOSIS — I10 Essential (primary) hypertension: Secondary | ICD-10-CM | POA: Diagnosis not present

## 2023-02-14 ENCOUNTER — Other Ambulatory Visit: Payer: Self-pay | Admitting: Physician Assistant

## 2023-02-14 DIAGNOSIS — H01004 Unspecified blepharitis left upper eyelid: Secondary | ICD-10-CM | POA: Diagnosis not present

## 2023-02-14 DIAGNOSIS — K5900 Constipation, unspecified: Secondary | ICD-10-CM | POA: Diagnosis not present

## 2023-02-14 DIAGNOSIS — R110 Nausea: Secondary | ICD-10-CM | POA: Diagnosis not present

## 2023-02-14 DIAGNOSIS — Z1231 Encounter for screening mammogram for malignant neoplasm of breast: Secondary | ICD-10-CM

## 2023-02-16 ENCOUNTER — Ambulatory Visit: Payer: Medicare Other | Attending: Cardiology

## 2023-02-17 DIAGNOSIS — M799 Soft tissue disorder, unspecified: Secondary | ICD-10-CM | POA: Diagnosis not present

## 2023-02-17 DIAGNOSIS — M6281 Muscle weakness (generalized): Secondary | ICD-10-CM | POA: Diagnosis not present

## 2023-02-17 DIAGNOSIS — M5459 Other low back pain: Secondary | ICD-10-CM | POA: Diagnosis not present

## 2023-02-17 DIAGNOSIS — M2569 Stiffness of other specified joint, not elsewhere classified: Secondary | ICD-10-CM | POA: Diagnosis not present

## 2023-02-17 DIAGNOSIS — M1990 Unspecified osteoarthritis, unspecified site: Secondary | ICD-10-CM | POA: Diagnosis not present

## 2023-02-24 ENCOUNTER — Inpatient Hospital Stay: Payer: Medicare Other

## 2023-02-24 ENCOUNTER — Inpatient Hospital Stay: Payer: Medicare Other | Attending: Hematology and Oncology | Admitting: Hematology and Oncology

## 2023-02-25 ENCOUNTER — Telehealth: Payer: Self-pay | Admitting: *Deleted

## 2023-02-25 NOTE — Telephone Encounter (Signed)
Pt showed up this afternoon for a new patient visit today, however her appt was scheduled for yesterday. Dr. Derek Mound scheduled is full today. Advised that we would have to have her appts rescheduled.. Pt voiced understanding.  New patient scheduling message for reschedule

## 2023-03-07 ENCOUNTER — Other Ambulatory Visit: Payer: Self-pay | Admitting: Cardiology

## 2023-03-07 ENCOUNTER — Ambulatory Visit: Payer: Medicare Other | Admitting: Primary Care

## 2023-03-07 DIAGNOSIS — D6861 Antiphospholipid syndrome: Secondary | ICD-10-CM

## 2023-03-07 DIAGNOSIS — Z7901 Long term (current) use of anticoagulants: Secondary | ICD-10-CM

## 2023-03-07 DIAGNOSIS — M799 Soft tissue disorder, unspecified: Secondary | ICD-10-CM | POA: Diagnosis not present

## 2023-03-07 DIAGNOSIS — M1990 Unspecified osteoarthritis, unspecified site: Secondary | ICD-10-CM | POA: Diagnosis not present

## 2023-03-07 DIAGNOSIS — M2569 Stiffness of other specified joint, not elsewhere classified: Secondary | ICD-10-CM | POA: Diagnosis not present

## 2023-03-07 DIAGNOSIS — M5459 Other low back pain: Secondary | ICD-10-CM | POA: Diagnosis not present

## 2023-03-07 DIAGNOSIS — M6281 Muscle weakness (generalized): Secondary | ICD-10-CM | POA: Diagnosis not present

## 2023-03-07 DIAGNOSIS — R760 Raised antibody titer: Secondary | ICD-10-CM

## 2023-04-12 NOTE — Progress Notes (Deleted)
 @Patient  ID: Kara Zamora, female    DOB: 05/10/1980, 43 y.o.   MRN: [id number]  No chief complaint on file.   Referring provider: Rosalea Rosina SAILOR, PA  HPI: 43 year old female, current everyday smoker.  Past medical history significant for hypertension, stroke, moderate persistent asthma, OSA, vitamin D deficiency, obesity.  Former patient of Dr. Alaine, last seen on 05/27/2022.  04/13/2023 Patient presents today for follow-up with pulmonary function testing.   Severe persistent asthma, maintained on Breztri  Aerosphere 2 puffs every 12 hours Smoking cessation strongly advised Stay up-to-date with immunizations Change from CPAP to BiPAP pressure 22/ 8 cm H2O       Chest imaging: September 2023 chest x-ray interpreted as clear lungs, elevated right hemidiaphragm 05/2022 High resolution CT chest > findings suggestive of small airways disease, no ILD  Sleep study: 05/2022 CPAP titration> failed CPAP, required BIPAP 22/18 to improve symptoms  No Known Allergies  Immunization History  Administered Date(s) Administered   Influenza Inj Mdck Quad With Preservative 12/12/2021   Janssen (J&J) SARS-COV-2 Vaccination 06/21/2019   PFIZER Comirnaty(Gray Top)Covid-19 Tri-Sucrose Vaccine 07/03/2020   PNEUMOCOCCAL CONJUGATE-20 12/12/2021   Pneumococcal Polysaccharide-23 05/31/2014   Tdap 09/30/2017    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HCC)    Asthma    Blind    Hypertension    Stroke (HCC)    Tobacco abuse 05/30/2014    Tobacco History: Social History   Tobacco Use  Smoking Status Every Day   Current packs/day: 0.50   Types: Cigarettes  Smokeless Tobacco Never  Tobacco Comments   Smokes 2-3 cigs a day.    Ready to quit: Not Answered Counseling given: Not Answered Tobacco comments: Smokes 2-3 cigs a day.    Outpatient Medications Prior to Visit  Medication Sig Dispense Refill   albuterol  (VENTOLIN  HFA) 108 (90 Base)  MCG/ACT inhaler Inhale 2 puffs into the lungs every 6 (six) hours as needed for wheezing or shortness of breath (Cough). 18 g 0   amLODipine-benazepril (LOTREL) 5-20 MG capsule Take 1 capsule by mouth daily.     Budeson-Glycopyrrol-Formoterol (BREZTRI  AEROSPHERE) 160-9-4.8 MCG/ACT AERO Inhale 2 puffs into the lungs in the morning and at bedtime. 10.7 g 3   buPROPion (WELLBUTRIN SR) 150 MG 12 hr tablet Take 1 tablet by mouth 2 (two) times daily.     cyclobenzaprine  (FLEXERIL ) 10 MG tablet Take 1 tablet (10 mg total) by mouth 2 (two) times daily as needed for muscle spasms. 14 tablet 0   hydrochlorothiazide  (HYDRODIURIL ) 25 MG tablet Take 1 tablet (25 mg total) by mouth daily. 14 tablet 0   miconazole  (MICOTIN) 2 % cream Apply 1 application. topically 2 (two) times daily. Apply to affected areas twice a day 28 g 0   montelukast (SINGULAIR) 10 MG tablet Take 10 mg by mouth daily.     mupirocin  ointment (BACTROBAN ) 2 % Apply 1 Application topically 3 (three) times daily. 30 g 0   nystatin  (MYCOSTATIN /NYSTOP ) powder Apply 1 application. topically 3 (three) times daily. Apply to affected areas three times a day 30 g 0   triamcinolone  cream (KENALOG ) 0.1 % Apply 1 Application topically 2 (two) times daily. 45 g 0   valACYclovir  (VALTREX ) 500 MG tablet Take 500 mg by mouth daily.     warfarin (COUMADIN ) 4 MG tablet NEEDS INR APPT FOR Coumadin  Refills; TAKE 1 TO 2 TABLETS BY MOUTH DAILY OR AS PRESCRIBED BY COUMADIN  CLINIC 10 tablet 0   No facility-administered medications  prior to visit.      Review of Systems  Review of Systems   Physical Exam  There were no vitals taken for this visit. Physical Exam   Lab Results:  CBC    Component Value Date/Time   WBC 8.7 04/26/2022 1442   RBC 4.45 04/26/2022 1442   HGB 12.7 04/26/2022 1442   HCT 39.1 04/26/2022 1442   PLT 429.0 (H) 04/26/2022 1442   MCV 87.9 04/26/2022 1442   MCH 28.1 03/31/2017 0803   MCHC 32.6 04/26/2022 1442   RDW 15.2  04/26/2022 1442   LYMPHSABS 2.9 04/26/2022 1442   MONOABS 0.7 04/26/2022 1442   EOSABS 0.1 04/26/2022 1442   BASOSABS 0.0 04/26/2022 1442    BMET    Component Value Date/Time   NA 138 03/03/2021 1236   K 4.8 03/03/2021 1236   CL 102 03/03/2021 1236   CO2 22 03/03/2021 1236   GLUCOSE 80 03/03/2021 1236   GLUCOSE 147 (H) 03/31/2017 0803   BUN 13 03/03/2021 1236   CREATININE 0.94 03/03/2021 1236   CALCIUM 10.1 03/03/2021 1236   GFRNONAA >60 03/31/2017 0803   GFRAA >60 03/31/2017 0803    BNP    Component Value Date/Time   BNP 35.5 07/04/2015 0227    ProBNP No results found for: PROBNP  Imaging: No results found.   Assessment & Plan:   No problem-specific Assessment & Plan notes found for this encounter.     Almarie LELON Ferrari, NP 04/12/2023

## 2023-04-12 NOTE — Progress Notes (Deleted)
   ANNUAL EXAM Patient name: Kara Zamora MRN [id number]  Date of birth: January 07, 1981 Chief Complaint:   No chief complaint on file.  History of Present Illness:   Kara Zamora is a 43 y.o. No obstetric history on file. female being seen today for a routine annual exam.   Current concerns: ***  Current birth control: ***  No LMP recorded.   Last MXR: Scheduled for 1/15 Last Pap/Pap History:  No recent pap on file.    Health Maintenance Due  Topic Date Due   HIV Screening  Never done   Hepatitis C Screening  Never done   Cervical Cancer Screening (HPV/Pap Cotest)  Never done   INFLUENZA VACCINE  11/04/2022   COVID-19 Vaccine (3 - 2024-25 season) 12/05/2022    Review of Systems:   Pertinent items are noted in HPI Denies any headaches, blurred vision, fatigue, shortness of breath, chest pain, abdominal pain, abnormal vaginal discharge/itching/odor/irritation, problems with periods, bowel movements, urination, or intercourse unless otherwise stated above. *** Pertinent History Reviewed:  Reviewed past medical,surgical, social and family history.  Reviewed problem list, medications and allergies. Physical Assessment:  There were no vitals filed for this visit.There is no height or weight on file to calculate BMI.   Physical Examination:  General appearance - well appearing, and in no distress Mental status - alert, oriented to person, place, and time Psych:  She has a normal mood and affect Skin - warm and dry, normal color, no suspicious lesions noted Chest - effort normal Heart - normal rate  Breasts - breasts appear normal, no suspicious masses, no skin or nipple changes or axillary nodes Abdomen - soft, nontender, nondistended, no masses or organomegaly Pelvic -  VULVA: normal appearing vulva with no masses, tenderness or lesions  VAGINA: normal appearing vagina with normal color and discharge, no lesions  CERVIX: normal appearing cervix without discharge or  lesions, no CMT UTERUS: uterus is felt to be normal size, shape, consistency and nontender  ADNEXA: No adnexal masses or tenderness noted. Extremities:  No swelling or varicosities noted  Chaperone present for exam  No results found for this or any previous visit (from the past 24 hours).  Assessment & Plan:  Diagnoses and all orders for this visit:  Encounter for annual routine gynecological examination  - Cervical cancer screening: Discussed guidelines. Pap with HPV done - STD Testing: {Blank single:19197::accepts,declines,not indicated} - Birth Control: *** - Breast Health: Encouraged self breast awareness/SBE. Teaching provided. Discussed limits of clinical breast exam for detecting breast cancer.  Scheduled for 1/15 - F/U 12 months and prn     No orders of the defined types were placed in this encounter.   Meds: No orders of the defined types were placed in this encounter.   Follow-up: No follow-ups on file.  Vina Solian, MD 04/12/2023 12:51 PM

## 2023-04-13 ENCOUNTER — Ambulatory Visit: Payer: Medicare Other | Admitting: Primary Care

## 2023-04-13 ENCOUNTER — Encounter: Payer: Medicare Other | Admitting: Obstetrics and Gynecology

## 2023-04-13 DIAGNOSIS — Z01419 Encounter for gynecological examination (general) (routine) without abnormal findings: Secondary | ICD-10-CM

## 2023-04-20 ENCOUNTER — Ambulatory Visit
Admission: RE | Admit: 2023-04-20 | Discharge: 2023-04-20 | Disposition: A | Discharge status: Home / Self Care | Payer: Medicare Other | Source: Ambulatory Visit | Attending: Physician Assistant | Admitting: Physician Assistant

## 2023-04-20 DIAGNOSIS — Z1231 Encounter for screening mammogram for malignant neoplasm of breast: Secondary | ICD-10-CM

## 2023-04-21 DIAGNOSIS — M25519 Pain in unspecified shoulder: Secondary | ICD-10-CM | POA: Diagnosis not present

## 2023-04-25 ENCOUNTER — Ambulatory Visit: Payer: Medicare Other | Attending: Cardiovascular Disease | Admitting: *Deleted

## 2023-04-25 DIAGNOSIS — M25512 Pain in left shoulder: Secondary | ICD-10-CM | POA: Diagnosis not present

## 2023-04-25 DIAGNOSIS — Z7901 Long term (current) use of anticoagulants: Secondary | ICD-10-CM | POA: Diagnosis not present

## 2023-04-25 DIAGNOSIS — Z8673 Personal history of transient ischemic attack (TIA), and cerebral infarction without residual deficits: Secondary | ICD-10-CM | POA: Diagnosis not present

## 2023-04-25 DIAGNOSIS — I10 Essential (primary) hypertension: Secondary | ICD-10-CM | POA: Diagnosis not present

## 2023-04-25 DIAGNOSIS — R760 Raised antibody titer: Secondary | ICD-10-CM | POA: Diagnosis not present

## 2023-04-25 DIAGNOSIS — J4540 Moderate persistent asthma, uncomplicated: Secondary | ICD-10-CM | POA: Diagnosis not present

## 2023-04-25 DIAGNOSIS — J40 Bronchitis, not specified as acute or chronic: Secondary | ICD-10-CM | POA: Diagnosis not present

## 2023-04-25 DIAGNOSIS — Z720 Tobacco use: Secondary | ICD-10-CM | POA: Diagnosis not present

## 2023-04-25 DIAGNOSIS — D6861 Antiphospholipid syndrome: Secondary | ICD-10-CM | POA: Diagnosis not present

## 2023-04-25 DIAGNOSIS — M545 Low back pain, unspecified: Secondary | ICD-10-CM | POA: Diagnosis not present

## 2023-04-25 DIAGNOSIS — R3911 Hesitancy of micturition: Secondary | ICD-10-CM | POA: Diagnosis not present

## 2023-04-25 LAB — POCT INR: INR: 1 — AB (ref 2.0–3.0)

## 2023-04-25 MED ORDER — WARFARIN SODIUM 4 MG PO TABS
ORAL_TABLET | ORAL | 0 refills | Status: DC
Start: 2023-04-25 — End: 2023-05-17

## 2023-04-25 NOTE — Patient Instructions (Signed)
Description   NO PRINTOUT NEEDED/USES RECORDER. Instructed pt to take 2 tablets of warfarin today and 2.5 tablets of warfarin tomorrow then continue taking 2 tablets daily except 1 tablet on Monday, Wednesday and Friday.  Recheck INR in 4 weeks.

## 2023-04-28 DIAGNOSIS — S39012D Strain of muscle, fascia and tendon of lower back, subsequent encounter: Secondary | ICD-10-CM | POA: Diagnosis not present

## 2023-04-28 DIAGNOSIS — M25512 Pain in left shoulder: Secondary | ICD-10-CM | POA: Diagnosis not present

## 2023-05-02 DIAGNOSIS — R110 Nausea: Secondary | ICD-10-CM | POA: Diagnosis not present

## 2023-05-02 DIAGNOSIS — K5900 Constipation, unspecified: Secondary | ICD-10-CM | POA: Diagnosis not present

## 2023-05-09 ENCOUNTER — Ambulatory Visit: Payer: Medicare Other | Attending: Cardiology | Admitting: *Deleted

## 2023-05-09 DIAGNOSIS — Z7901 Long term (current) use of anticoagulants: Secondary | ICD-10-CM | POA: Diagnosis not present

## 2023-05-09 DIAGNOSIS — R760 Raised antibody titer: Secondary | ICD-10-CM | POA: Diagnosis not present

## 2023-05-09 LAB — POCT INR: INR: 1.5 — AB (ref 2.0–3.0)

## 2023-05-09 NOTE — Patient Instructions (Addendum)
Description   NO PRINTOUT NEEDED/USES RECORDER. Instructed pt to take 2 tablets of warfarin today and 2.5 tablets of warfarin tomorrow then START taking 2 tablets daily except 1 tablet on Monday and Friday.  Recheck INR in 2 weeks.

## 2023-05-17 ENCOUNTER — Other Ambulatory Visit: Payer: Self-pay | Admitting: Cardiology

## 2023-05-17 DIAGNOSIS — D6861 Antiphospholipid syndrome: Secondary | ICD-10-CM

## 2023-05-17 DIAGNOSIS — Z7901 Long term (current) use of anticoagulants: Secondary | ICD-10-CM

## 2023-05-17 DIAGNOSIS — R760 Raised antibody titer: Secondary | ICD-10-CM

## 2023-05-20 ENCOUNTER — Encounter (HOSPITAL_COMMUNITY): Payer: Self-pay

## 2023-05-20 ENCOUNTER — Emergency Department (HOSPITAL_COMMUNITY): Admission: EM | Admit: 2023-05-20 | Discharge: 2023-06-04 | Disposition: E | Payer: Medicare Other

## 2023-05-20 DIAGNOSIS — Z7901 Long term (current) use of anticoagulants: Secondary | ICD-10-CM | POA: Diagnosis not present

## 2023-05-20 DIAGNOSIS — J45909 Unspecified asthma, uncomplicated: Secondary | ICD-10-CM | POA: Insufficient documentation

## 2023-05-20 DIAGNOSIS — R404 Transient alteration of awareness: Secondary | ICD-10-CM | POA: Diagnosis not present

## 2023-05-20 DIAGNOSIS — Z7951 Long term (current) use of inhaled steroids: Secondary | ICD-10-CM | POA: Insufficient documentation

## 2023-05-20 DIAGNOSIS — W19XXXA Unspecified fall, initial encounter: Secondary | ICD-10-CM | POA: Diagnosis not present

## 2023-05-20 DIAGNOSIS — I499 Cardiac arrhythmia, unspecified: Secondary | ICD-10-CM | POA: Diagnosis not present

## 2023-05-20 DIAGNOSIS — I469 Cardiac arrest, cause unspecified: Secondary | ICD-10-CM | POA: Insufficient documentation

## 2023-05-20 DIAGNOSIS — Z743 Need for continuous supervision: Secondary | ICD-10-CM | POA: Diagnosis not present

## 2023-05-20 DIAGNOSIS — R58 Hemorrhage, not elsewhere classified: Secondary | ICD-10-CM | POA: Diagnosis not present

## 2023-05-20 MED ORDER — EPINEPHRINE 1 MG/10ML IJ SOSY
PREFILLED_SYRINGE | INTRAMUSCULAR | Status: AC | PRN
  Administered 2023-05-20 (×2): 1 mg via INTRAVENOUS

## 2023-05-20 MED ORDER — DEXTROSE 50 % IV SOLN
INTRAVENOUS | Status: AC | PRN
Start: 2023-05-20 — End: 2023-05-20
  Administered 2023-05-20: 1 via INTRAVENOUS

## 2023-05-20 MED ORDER — SODIUM BICARBONATE 8.4 % IV SOLN
INTRAVENOUS | Status: AC | PRN
Start: 2023-05-20 — End: 2023-05-20
  Administered 2023-05-20: 50 meq via INTRAVENOUS

## 2023-05-20 MED ORDER — EPINEPHRINE 1 MG/10ML IJ SOSY
PREFILLED_SYRINGE | INTRAMUSCULAR | Status: AC | PRN
Start: 2023-05-20 — End: 2023-05-20
  Administered 2023-05-20: 1 mg via INTRAVENOUS

## 2023-05-23 ENCOUNTER — Ambulatory Visit: Payer: Medicare Other

## 2023-05-25 ENCOUNTER — Telehealth: Payer: Self-pay | Admitting: *Deleted

## 2023-05-25 NOTE — Telephone Encounter (Signed)
[facility] director, Kalman Shan called regarding EDP signature on death certificate.  RNCM left secure chat for EDP with request.

## 2023-05-31 ENCOUNTER — Encounter: Payer: Medicare Other | Admitting: Obstetrics and Gynecology

## 2023-06-03 ENCOUNTER — Telehealth: Payer: Medicare Other | Admitting: Primary Care

## 2023-06-04 NOTE — ED Notes (Signed)
Unable to removes right tibial IO line.

## 2023-06-04 NOTE — ED Notes (Signed)
Patient family requesting belongings. Personal belongings placed in bag and and sent with family.

## 2023-06-04 NOTE — ED Notes (Signed)
I have called and spoke with Megan Plummer-ME. [phone]. She is looking into the patient and will call back to confirm ME status.  Presumably she will call the secretary and ask for either you or Anna-RN as those are the names I provided her.

## 2023-06-04 NOTE — Progress Notes (Signed)
   05/11/2023 2032  Spiritual Encounters  Type of Visit Initial  Care provided to: Pt and family  Conversation partners present during encounter Nurse;Physician  Referral source Physician  Reason for visit Patient death  OnCall Visit Yes   Chaplain was called to accompany physician as he informed family that Pt did not survive.  Chaplain remained with family as they grieved, providing compassionate presence and grief counseling.  Chaplain escorted family to Pt bedside and remained with them until I was called to another room.  Chaplain provided family with Patient Placement Card and a list of local funeral homes.  Chaplain services remain available by Spiritual Consult or for emergent cases, paging [phone]  Chaplain Raelene Bott, MDiv [email or web address] [phone]

## 2023-06-04 NOTE — ED Triage Notes (Signed)
Pt BIB [facility] as a CPR in progess.  Pt found down at a warehouse.  [facility] CPR initiated at 1542.  ROSC obtained at 1558 for 12 min.  CPR resumed by EMS at 1610.  Total of 13 EPI administered.  Pt remained Asystole or PEA for all pulse checks after CPR resumed.   Unwitnessed fall as evidenced by abrasions to face per EMS

## 2023-06-04 NOTE — ED Provider Notes (Signed)
[place] EMERGENCY DEPARTMENT AT [facility] Provider Note   CSN: [id number] Arrival date & time: 05/12/2023  1704     History  No chief complaint on file.   Kara Zamora is a 43 y.o. female.  43 year old female with past medical history of morbid obesity as well as into phospholipid syndrome and persistent asthma presenting to the emergency department today as a CPR in progress.  [facility]  She was possibly down for 30 minutes prior to medics arriving.  They were able to obtain ROSC on the patient briefly.  She did have ROSC for a brief period but did lose pulses.  She was in asystole during her last pulse checks.  Was in PEA initially.        Home Medications Prior to Admission medications   Medication Sig Start Date End Date Taking? Authorizing Provider  buPROPion (ZYBAN) 150 MG 12 hr tablet Take 150 mg by mouth 2 (two) times daily.   Yes [provider]  albuterol (VENTOLIN HFA) 108 (90 Base) MCG/ACT inhaler Inhale 2 puffs into the lungs every 6 (six) hours as needed for wheezing or shortness of breath (Cough). 12/07/21   Theadora Rama Scales, PA-C  amLODipine-benazepril (LOTREL) 10-40 MG capsule Take 1 capsule by mouth daily.    [provider]  Budeson-Glycopyrrol-Formoterol (BREZTRI AEROSPHERE) 160-9-4.8 MCG/ACT AERO Inhale 2 puffs into the lungs in the morning and at bedtime. 11/05/22   Luciano Cutter, MD  cyclobenzaprine (FLEXERIL) 10 MG tablet Take 1 tablet (10 mg total) by mouth 2 (two) times daily as needed for muscle spasms. 12/15/22   Schutt, Edsel Petrin, PA-C  hydrochlorothiazide (HYDRODIURIL) 25 MG tablet Take 1 tablet (25 mg total) by mouth daily. 09/07/21   Lurline Idol, FNP  hydrocortisone 2.5 % cream Apply 1 Application topically 2 (two) times daily as needed (skin irritation).    [provider]  hydrOXYzine (ATARAX) 50 MG tablet Take 50 mg by mouth every 6 (six) hours as  needed.    [provider]  lubiprostone (AMITIZA) 24 MCG capsule Take 24 mcg by mouth 2 (two) times daily.    [provider]  methocarbamol (ROBAXIN) 500 MG tablet Take 500 mg by mouth every 6 (six) hours as needed for muscle spasms.    [provider]  miconazole (MICOTIN) 2 % cream Apply 1 application. topically 2 (two) times daily. Apply to affected areas twice a day 09/07/21   Lurline Idol, FNP  montelukast (SINGULAIR) 10 MG tablet Take 10 mg by mouth daily. 03/08/22   [provider]  omeprazole (PRILOSEC) 40 MG capsule Take 40 mg by mouth daily.    [provider]  ondansetron (ZOFRAN-ODT) 8 MG disintegrating tablet Take 8 mg by mouth every 8 (eight) hours as needed for nausea or vomiting.    [provider]  predniSONE (DELTASONE) 20 MG tablet Take 20 mg by mouth daily.    [provider]  triamcinolone cream (KENALOG) 0.1 % Apply 1 Application topically 2 (two) times daily. 11/16/22   Wallis Bamberg, PA-C  valACYclovir (VALTREX) 500 MG tablet Take 500 mg by mouth 2 (two) times daily.    [provider]  warfarin (COUMADIN) 4 MG tablet TAKE 1 TO 2 TABLETS BY MOUTH DAILY OR AS PRESCRIBED BY COUMADIN CLINIC 30 DAY SUPPLY Patient taking differently: Take 4-8 mg by mouth See admin instructions. Take 1 tablet (4mg ) once daily on Monday, Wednesday, Friday and take  2 tablets (8mg ) once daily on all other days. 05/17/23   Rollene Rotunda, MD      Allergies    Patient has no known allergies.    Review of Systems   Review of Systems  Reason unable to perform ROS: Patient attended.    Physical Exam Updated Vital Signs There were no vitals taken for this visit. Physical Exam Vitals and nursing note reviewed.   Gen: Obtunded Eyes: Pupils dilated and nonreactive HEENT: oral airway in place Resp: Equal breath sounds with bagging Card: pulseless Abd: nondistended Neuro: GCS 3T   ED Results / Procedures / Treatments    Labs (all labs ordered are listed, but only abnormal results are displayed) Labs Reviewed  BASIC METABOLIC PANEL  CBC  HCG, SERUM, QUALITATIVE  TROPONIN I (HIGH SENSITIVITY)    EKG None  Radiology No results found.  Procedures Procedures    Medications Ordered in ED Medications - No data to display  ED Course/ Medical Decision Making/ A&P                                 Medical Decision Making 43 year old female with past medical history of severe asthma and antiphospholipid syndrome presenting to the emergency department today for CPR in progress.  The patient remains in asystole here.  She is given epi, bicarbonate, dextrose here.  An airway was established.  The patient remained in asystole.  She had no cardiac activity on bedside ultrasound.  The patient was pronounced deceased at 20.  Amount and/or Complexity of Data Reviewed Labs: ordered. Radiology: ordered.           Final Clinical Impression(s) / ED Diagnoses Final diagnoses:  Cardiac arrest [facility])    Rx / DC Orders ED Discharge Orders     None         Durwin Glaze, MD 05/12/2023 1729

## 2023-06-04 DEATH — deceased
